# Patient Record
Sex: Male | Born: 1940 | Race: White | Hispanic: No | Smoking: Former smoker
Health system: Southern US, Community
[De-identification: ages and names within clinical notes are randomized; demographics above are authoritative.]

## PROBLEM LIST (undated history)

## (undated) DIAGNOSIS — E785 Hyperlipidemia, unspecified: Secondary | ICD-10-CM

## (undated) DIAGNOSIS — N2889 Other specified disorders of kidney and ureter: Secondary | ICD-10-CM

## (undated) DIAGNOSIS — E049 Nontoxic goiter, unspecified: Secondary | ICD-10-CM

## (undated) DIAGNOSIS — I639 Cerebral infarction, unspecified: Secondary | ICD-10-CM

## (undated) DIAGNOSIS — N200 Calculus of kidney: Secondary | ICD-10-CM

## (undated) DIAGNOSIS — K5792 Diverticulitis of intestine, part unspecified, without perforation or abscess without bleeding: Secondary | ICD-10-CM

## (undated) DIAGNOSIS — I2510 Atherosclerotic heart disease of native coronary artery without angina pectoris: Secondary | ICD-10-CM

## (undated) DIAGNOSIS — I10 Essential (primary) hypertension: Secondary | ICD-10-CM

## (undated) DIAGNOSIS — J449 Chronic obstructive pulmonary disease, unspecified: Secondary | ICD-10-CM

## (undated) DIAGNOSIS — N400 Enlarged prostate without lower urinary tract symptoms: Secondary | ICD-10-CM

## (undated) DIAGNOSIS — M1990 Unspecified osteoarthritis, unspecified site: Secondary | ICD-10-CM

## (undated) HISTORY — PX: TONSILLECTOMY: SUR1361

## (undated) HISTORY — DX: Nontoxic goiter, unspecified: E04.9

## (undated) HISTORY — PX: APPENDECTOMY: SHX54

## (undated) HISTORY — DX: Atherosclerotic heart disease of native coronary artery without angina pectoris: I25.10

## (undated) HISTORY — DX: Chronic obstructive pulmonary disease, unspecified: J44.9

## (undated) HISTORY — DX: Essential (primary) hypertension: I10

## (undated) HISTORY — DX: Cerebral infarction, unspecified: I63.9

## (undated) HISTORY — DX: Unspecified osteoarthritis, unspecified site: M19.90

## (undated) HISTORY — DX: Hyperlipidemia, unspecified: E78.5

## (undated) HISTORY — PX: LITHOTRIPSY: SUR834

---

## 1962-10-07 HISTORY — PX: OTHER SURGICAL HISTORY: SHX169

## 1993-10-07 DIAGNOSIS — I639 Cerebral infarction, unspecified: Secondary | ICD-10-CM

## 1993-10-07 HISTORY — DX: Cerebral infarction, unspecified: I63.9

## 2008-10-11 ENCOUNTER — Ambulatory Visit: Payer: Self-pay | Admitting: Family Medicine

## 2008-10-11 DIAGNOSIS — E785 Hyperlipidemia, unspecified: Secondary | ICD-10-CM | POA: Insufficient documentation

## 2008-10-11 DIAGNOSIS — I10 Essential (primary) hypertension: Secondary | ICD-10-CM | POA: Insufficient documentation

## 2008-10-11 DIAGNOSIS — M129 Arthropathy, unspecified: Secondary | ICD-10-CM | POA: Insufficient documentation

## 2008-10-11 DIAGNOSIS — K219 Gastro-esophageal reflux disease without esophagitis: Secondary | ICD-10-CM | POA: Insufficient documentation

## 2008-10-11 DIAGNOSIS — N318 Other neuromuscular dysfunction of bladder: Secondary | ICD-10-CM | POA: Insufficient documentation

## 2008-10-18 ENCOUNTER — Encounter (INDEPENDENT_AMBULATORY_CARE_PROVIDER_SITE_OTHER): Payer: Self-pay | Admitting: Family Medicine

## 2008-11-03 ENCOUNTER — Encounter (INDEPENDENT_AMBULATORY_CARE_PROVIDER_SITE_OTHER): Payer: Self-pay | Admitting: Family Medicine

## 2008-11-09 ENCOUNTER — Ambulatory Visit: Payer: Self-pay | Admitting: Family Medicine

## 2008-11-09 DIAGNOSIS — E669 Obesity, unspecified: Secondary | ICD-10-CM

## 2008-11-09 LAB — CONVERTED CEMR LAB
Cholesterol, target level: 200 mg/dL
HDL goal, serum: 40 mg/dL
LDL Goal: 100 mg/dL

## 2008-11-10 ENCOUNTER — Encounter (INDEPENDENT_AMBULATORY_CARE_PROVIDER_SITE_OTHER): Payer: Self-pay | Admitting: Family Medicine

## 2008-11-10 LAB — CONVERTED CEMR LAB
Alkaline Phosphatase: 88 units/L (ref 39–117)
CO2: 19 meq/L (ref 19–32)
Calcium: 9.5 mg/dL (ref 8.4–10.5)
Chloride: 103 meq/L (ref 96–112)
Creatinine, Ser: 1.34 mg/dL (ref 0.40–1.50)
Potassium: 4.1 meq/L (ref 3.5–5.3)
Total Protein: 7.9 g/dL (ref 6.0–8.3)

## 2008-12-06 ENCOUNTER — Telehealth (INDEPENDENT_AMBULATORY_CARE_PROVIDER_SITE_OTHER): Payer: Self-pay

## 2008-12-06 ENCOUNTER — Ambulatory Visit: Payer: Self-pay | Admitting: Internal Medicine

## 2008-12-20 ENCOUNTER — Ambulatory Visit (HOSPITAL_COMMUNITY): Admission: RE | Admit: 2008-12-20 | Discharge: 2008-12-20 | Payer: Self-pay | Admitting: Internal Medicine

## 2008-12-20 ENCOUNTER — Ambulatory Visit: Payer: Self-pay | Admitting: Internal Medicine

## 2008-12-20 HISTORY — PX: COLONOSCOPY: SHX5424

## 2008-12-28 ENCOUNTER — Ambulatory Visit: Payer: Self-pay | Admitting: Family Medicine

## 2009-02-09 ENCOUNTER — Ambulatory Visit: Payer: Self-pay | Admitting: Family Medicine

## 2009-02-10 ENCOUNTER — Encounter (INDEPENDENT_AMBULATORY_CARE_PROVIDER_SITE_OTHER): Payer: Self-pay | Admitting: *Deleted

## 2009-02-10 ENCOUNTER — Encounter (INDEPENDENT_AMBULATORY_CARE_PROVIDER_SITE_OTHER): Payer: Self-pay | Admitting: Family Medicine

## 2009-02-10 LAB — CONVERTED CEMR LAB
ALT: 14 units/L (ref 0–53)
AST: 20 units/L (ref 0–37)
Alkaline Phosphatase: 85 units/L (ref 39–117)
BUN: 27 mg/dL — ABNORMAL HIGH (ref 6–23)
CO2: 19 meq/L (ref 19–32)
Calcium: 9.7 mg/dL (ref 8.4–10.5)
Chloride: 104 meq/L (ref 96–112)
Glucose, Bld: 93 mg/dL (ref 70–99)
HCT: 43.9 % (ref 39.0–52.0)
Hemoglobin: 15.2 g/dL (ref 13.0–17.0)
Lymphocytes Relative: 26 % (ref 12–46)
Lymphs Abs: 2.5 10*3/uL (ref 0.7–4.0)
MCHC: 34.6 g/dL (ref 30.0–36.0)
Neutro Abs: 5.8 10*3/uL (ref 1.7–7.7)
Neutrophils Relative %: 59 % (ref 43–77)
Platelets: 239 10*3/uL (ref 150–400)
RBC: 4.69 M/uL (ref 4.22–5.81)
RDW: 12.7 % (ref 11.5–15.5)
TSH: 0.697 microintl units/mL (ref 0.350–4.500)
Total Bilirubin: 0.7 mg/dL (ref 0.3–1.2)
Total CHOL/HDL Ratio: 4.5
Total Protein: 7.8 g/dL (ref 6.0–8.3)

## 2009-02-15 ENCOUNTER — Encounter (INDEPENDENT_AMBULATORY_CARE_PROVIDER_SITE_OTHER): Payer: Self-pay | Admitting: Family Medicine

## 2009-05-08 ENCOUNTER — Ambulatory Visit (HOSPITAL_COMMUNITY): Admission: RE | Admit: 2009-05-08 | Discharge: 2009-05-08 | Payer: Self-pay | Admitting: Family Medicine

## 2009-05-08 ENCOUNTER — Ambulatory Visit: Payer: Self-pay | Admitting: Family Medicine

## 2009-05-08 DIAGNOSIS — R5383 Other fatigue: Secondary | ICD-10-CM

## 2009-05-08 DIAGNOSIS — M25569 Pain in unspecified knee: Secondary | ICD-10-CM

## 2009-05-08 DIAGNOSIS — R5381 Other malaise: Secondary | ICD-10-CM

## 2009-05-09 ENCOUNTER — Encounter (INDEPENDENT_AMBULATORY_CARE_PROVIDER_SITE_OTHER): Payer: Self-pay | Admitting: Family Medicine

## 2009-05-10 LAB — CONVERTED CEMR LAB
ALT: 17 units/L (ref 0–53)
AST: 26 units/L (ref 0–37)
Alkaline Phosphatase: 107 units/L (ref 39–117)
CO2: 18 meq/L — ABNORMAL LOW (ref 19–32)
Calcium: 9.6 mg/dL (ref 8.4–10.5)
Glucose, Bld: 79 mg/dL (ref 70–99)
HCT: 45 % (ref 39.0–52.0)
Lymphocytes Relative: 31 % (ref 12–46)
Lymphs Abs: 2.5 10*3/uL (ref 0.7–4.0)
MCHC: 33.6 g/dL (ref 30.0–36.0)
MCV: 96.8 fL (ref 78.0–100.0)
Neutrophils Relative %: 48 % (ref 43–77)
RBC: 4.65 M/uL (ref 4.22–5.81)
Sodium: 141 meq/L (ref 135–145)
Total Bilirubin: 0.4 mg/dL (ref 0.3–1.2)
WBC: 8.2 10*3/uL (ref 4.0–10.5)

## 2009-05-11 ENCOUNTER — Encounter (INDEPENDENT_AMBULATORY_CARE_PROVIDER_SITE_OTHER): Payer: Self-pay | Admitting: Family Medicine

## 2009-05-16 ENCOUNTER — Encounter (INDEPENDENT_AMBULATORY_CARE_PROVIDER_SITE_OTHER): Payer: Self-pay | Admitting: Family Medicine

## 2009-05-16 ENCOUNTER — Encounter (HOSPITAL_COMMUNITY): Admission: RE | Admit: 2009-05-16 | Discharge: 2009-06-15 | Payer: Self-pay | Admitting: Family Medicine

## 2009-06-01 ENCOUNTER — Ambulatory Visit: Payer: Self-pay | Admitting: Family Medicine

## 2009-11-08 ENCOUNTER — Ambulatory Visit: Payer: Self-pay | Admitting: Family Medicine

## 2009-11-08 DIAGNOSIS — H547 Unspecified visual loss: Secondary | ICD-10-CM | POA: Insufficient documentation

## 2009-11-08 DIAGNOSIS — E049 Nontoxic goiter, unspecified: Secondary | ICD-10-CM | POA: Insufficient documentation

## 2009-11-09 ENCOUNTER — Encounter: Payer: Self-pay | Admitting: Family Medicine

## 2009-11-13 ENCOUNTER — Ambulatory Visit (HOSPITAL_COMMUNITY): Admission: RE | Admit: 2009-11-13 | Discharge: 2009-11-13 | Payer: Self-pay | Admitting: Family Medicine

## 2009-11-18 DIAGNOSIS — H6120 Impacted cerumen, unspecified ear: Secondary | ICD-10-CM

## 2009-11-22 ENCOUNTER — Ambulatory Visit: Payer: Self-pay | Admitting: Family Medicine

## 2009-11-23 LAB — CONVERTED CEMR LAB
AST: 17 units/L (ref 0–37)
Albumin: 4.6 g/dL (ref 3.5–5.2)
BUN: 22 mg/dL (ref 6–23)
Basophils Absolute: 0.1 10*3/uL (ref 0.0–0.1)
Basophils Relative: 1 % (ref 0–1)
CO2: 25 meq/L (ref 19–32)
Calcium: 9.6 mg/dL (ref 8.4–10.5)
Eosinophils Absolute: 1.2 10*3/uL — ABNORMAL HIGH (ref 0.0–0.7)
HDL: 30 mg/dL — ABNORMAL LOW (ref >39)
Hemoglobin: 14.8 g/dL (ref 13.0–17.0)
Indirect Bilirubin: 0.2 mg/dL (ref 0.0–0.9)
LDL Cholesterol: 108 mg/dL — ABNORMAL HIGH (ref 0–99)
Lymphocytes Relative: 27 % (ref 12–46)
Lymphs Abs: 2.5 10*3/uL (ref 0.7–4.0)
MCHC: 33.2 g/dL (ref 30.0–36.0)
Monocytes Absolute: 0.9 10*3/uL (ref 0.1–1.0)
Monocytes Relative: 9 % (ref 3–12)
Platelets: 272 10*3/uL (ref 150–400)
RDW: 13.1 % (ref 11.5–15.5)
Total Bilirubin: 0.3 mg/dL (ref 0.3–1.2)
Total Protein: 7.4 g/dL (ref 6.0–8.3)
VLDL: 10 mg/dL (ref 0–40)
WBC: 9.2 10*3/uL (ref 4.0–10.5)

## 2010-03-13 ENCOUNTER — Ambulatory Visit: Payer: Self-pay | Admitting: Family Medicine

## 2010-03-14 ENCOUNTER — Emergency Department (HOSPITAL_COMMUNITY): Admission: EM | Admit: 2010-03-14 | Discharge: 2010-03-14 | Payer: Self-pay | Admitting: Emergency Medicine

## 2010-03-18 LAB — CONVERTED CEMR LAB
AST: 21 units/L (ref 0–37)
Bilirubin, Direct: 0.2 mg/dL (ref 0.0–0.3)
CO2: 20 meq/L (ref 19–32)
Calcium: 9.2 mg/dL (ref 8.4–10.5)
HDL: 26 mg/dL — ABNORMAL LOW (ref >39)
Indirect Bilirubin: 0.2 mg/dL (ref 0.0–0.9)
Total CHOL/HDL Ratio: 3.7
VLDL: 14 mg/dL (ref 0–40)
Vit D, 25-Hydroxy: 51 ng/mL (ref 30–89)

## 2010-04-05 ENCOUNTER — Ambulatory Visit: Payer: Self-pay | Admitting: Family Medicine

## 2010-04-05 DIAGNOSIS — E860 Dehydration: Secondary | ICD-10-CM

## 2010-04-05 DIAGNOSIS — R9431 Abnormal electrocardiogram [ECG] [EKG]: Secondary | ICD-10-CM | POA: Insufficient documentation

## 2010-04-05 DIAGNOSIS — I498 Other specified cardiac arrhythmias: Secondary | ICD-10-CM

## 2010-04-06 LAB — CONVERTED CEMR LAB
BUN: 22 mg/dL (ref 6–23)
Basophils Absolute: 0.1 10*3/uL (ref 0.0–0.1)
Basophils Relative: 1 % (ref 0–1)
Creatinine, Ser: 1.59 mg/dL — ABNORMAL HIGH (ref 0.40–1.50)
Glucose, Bld: 88 mg/dL (ref 70–99)
HCT: 45.1 % (ref 39.0–52.0)
Lymphocytes Relative: 29 % (ref 12–46)
Lymphs Abs: 2.4 10*3/uL (ref 0.7–4.0)
MCHC: 32.8 g/dL (ref 30.0–36.0)
Monocytes Absolute: 0.9 10*3/uL (ref 0.1–1.0)
Neutro Abs: 4.3 10*3/uL (ref 1.7–7.7)
Neutrophils Relative %: 53 % (ref 43–77)
Platelets: 227 10*3/uL (ref 150–400)
Potassium: 3.7 meq/L (ref 3.5–5.3)
RDW: 13.7 % (ref 11.5–15.5)
Sodium: 142 meq/L (ref 135–145)

## 2010-04-11 ENCOUNTER — Ambulatory Visit: Payer: Self-pay | Admitting: Cardiovascular Disease

## 2010-04-11 DIAGNOSIS — I2510 Atherosclerotic heart disease of native coronary artery without angina pectoris: Secondary | ICD-10-CM | POA: Insufficient documentation

## 2010-04-18 ENCOUNTER — Ambulatory Visit: Payer: Self-pay | Admitting: Cardiology

## 2010-04-18 ENCOUNTER — Encounter: Payer: Self-pay | Admitting: Cardiovascular Disease

## 2010-04-18 ENCOUNTER — Encounter (HOSPITAL_COMMUNITY): Admission: RE | Admit: 2010-04-18 | Discharge: 2010-04-18 | Payer: Self-pay | Admitting: Cardiovascular Disease

## 2010-06-05 ENCOUNTER — Ambulatory Visit: Payer: Self-pay | Admitting: Family Medicine

## 2010-06-26 ENCOUNTER — Ambulatory Visit: Payer: Self-pay | Admitting: Family Medicine

## 2010-06-28 ENCOUNTER — Encounter: Payer: Self-pay | Admitting: Cardiovascular Disease

## 2010-07-25 ENCOUNTER — Telehealth: Payer: Self-pay | Admitting: Family Medicine

## 2010-10-16 ENCOUNTER — Encounter: Payer: Self-pay | Admitting: Family Medicine

## 2010-10-16 LAB — CONVERTED CEMR LAB
AST: 16 units/L (ref 0–37)
Alkaline Phosphatase: 104 units/L (ref 39–117)
Calcium: 9.8 mg/dL (ref 8.4–10.5)
Creatinine, Ser: 1.19 mg/dL (ref 0.40–1.50)
HDL: 29 mg/dL — ABNORMAL LOW (ref >39)
Indirect Bilirubin: 0.4 mg/dL (ref 0.0–0.9)
Total Bilirubin: 0.6 mg/dL (ref 0.3–1.2)
Total CHOL/HDL Ratio: 4.6
VLDL: 12 mg/dL (ref 0–40)

## 2010-10-24 ENCOUNTER — Ambulatory Visit
Admission: RE | Admit: 2010-10-24 | Discharge: 2010-10-24 | Payer: Self-pay | Source: Home / Self Care | Attending: Family Medicine | Admitting: Family Medicine
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
## 2010-11-06 NOTE — Progress Notes (Signed)
Summary: refill  Phone Note Call from Patient   Summary of Call: please call pt he needs refills on meds. [phone] Initial call taken by: Rudene Anda,  July 25, 2010 10:57 AM    Prescriptions: LISINOPRIL 20 MG TABS (LISINOPRIL) Take 1 tablet by mouth once a day  #90 x 0   Entered by:   Adella Hare LPN   Authorized by:   Syliva Overman MD   Signed by:   Adella Hare LPN on 29/56/2130   Method used:   Faxed to ...       [facility] (mail-order)             , [place]         Ph: [phone]       Fax: [phone]   RxID:   [id number]
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
## 2010-11-06 NOTE — Letter (Signed)
Summary: HANDICAPP CARD  HANDICAPP CARD   Imported By: Lind Guest 11/08/2009 14:22:07  _____________________________________________________________________  External Attachment:    Type:   Image     Comment:   External Document

## 2010-11-06 NOTE — Letter (Signed)
Summary: Sheldon Treadmill (Nuc Med Stress)  [facility]  [address] [place] [address]   Phone: [phone]  Fax: [phone]    Nuclear Medicine 1-Day Stress Test Information Sheet  Re:     Randy Garrett   DOB:     08-06-1941 MRN:     [id number] Weight:  Appointment Date: Register at: Appointment Time: Referring MD:  ___Exercise Stress  __Adenosine   __Dobutamine  _X_Lexiscan  __Persantine   __Thallium  Urgency: ____1 (next day)   ____2 (one week)    ____3 (PRN)  Patient will receive Follow Up call with results: Patient needs follow-up appointment:  Instructions regarding medication:  How to prepare for your stress test: 1. DO NOT eat or dring 6 hours prior to your arrival time. This includes no caffeine (coffee, tea, sodas, chocolate) if you were instructed to take your medications, drink water with it. 2. DO NOT use any tobacco products for at leaset 8 hours prior to arrival. 3. DO NOT wear dresses or any clothing that may have metal clasps or buttons. 4. Wear short sleeve shirts, loose clothing, and comfortalbe walking shoes. 5. DO NOT use lotions, oils or powder on your chest before the test. 6. The test will take approximately 3-4 hours from the time you arrive until completion. 7. To register the day of the test, go to the Short Stay entrance at [facility]. 8. If you must cancel your test, call [phone] as soon as you are aware.  After you arrive for test:   When you arrive at [facility], you will go to Short Stay to be registered. They will then send you to Radiology to check in. The Nuclear Medicine Tech will get you and start an IV in your arm or hand. A small amount of a radioactive tracer will then be injected into your IV. This tracer will then have to circulate for 30-45 minutes. During this time you will wait in the waiting room and you will be able to drink something without caffeine. A series of pictures will be  taken of your heart follwoing this waiting period. After the 1st set of pictures you will go to the stress lab to get ready for your stress test. During the stress test, another small amount of a radioactive tracer will be injected through your IV. When the stress test is complete, there is a short rest period while your heart rate and blood pressure will be monitored. When this monitoring period is complete you will have another set of pictrues taken. (The same as the 1st set of pictures). These pictures are taken between 15 minutes and 1 hour after the stress test. The time depends on the type of stress test you had. Your doctor will inform you of your test results within 7 days after test.    The possibilities of certain changes are possible during the test. They include abnormal blood pressure and disorders of the heart. Side effects of persantine or adenosine can include flushing, chest pain, shortness of breath, stomach tightness, headache and light-headedness. These side effects usually do not last long and are self-resolving. Every effort will be made to keep you comfortable and to minimize complications by obtaining a medical history and by close observation during the test. Emergency equipment, medications, and trained personnel are available to deal with any unusual situation which may arise.  Please notify office at least 48 hours in advance if you are unable to keep  this appt.

## 2010-11-06 NOTE — Assessment & Plan Note (Signed)
Summary: ear  cleaning  Nurse Visit   Vital Signs:  Patient profile:   70 year old male Weight:      208.25 pounds BP sitting:   109 / 64  Vitals Entered By: Adella Hare LPN (November 22, 2009 11:28 AM) Comments patient in today for bilateral ear irrigation. both ears cleared. pt tolerated procedure well. Adella Hare LPN  November 22, 2009 11:29 AM     Allergies: No Known Drug Allergies  Orders Added: 1)  Cerumen Impaction Removal [[id number]]

## 2010-11-06 NOTE — Assessment & Plan Note (Signed)
Summary: PER DR.SIMPSON FOR EVALUATION OF ABNORMAL EKG/TG   Visit Type:  Initial Consult Primary Provider:  Syliva Overman MD  CC:  EVAUATION OF ABNORMAL EKG.  History of Present Illness: Randy Garrett is seen today at the request of Dr Lodema Hong for abnromal ECG, bradycardia.  He has had a previous CVA.  Denies SSCP or history of CAD.  Recently seen in ER for dehydration with BUN 72 and bradycardia HR 44 but on both Atenolol and HCTZ.  His CVA was in 95 with right sided weakness.  His activity is limited by RLE weakness and knee pain.  He has not had a recent echo or stress test.  I reviewed his ECG from Dr Luther Parody office and it showed SB 75 with ? old IMI Q's in 2,3,F.  He quit smoking many years ago, is Rx for elevated lipids and HTN>  Despite bradycardia and prerenal azotemia he has continued taking HCTZ and Atenolol.  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 One Two Times A Day 2)  Atenolol 25 Mg Tabs (Atenolol) .... Take 1 Tablet By Mouth Once Daily 3)  Simvastatin 20 Mg Tabs (Simvastatin) .... One Daily 4)  Garlic 1000 Mg Caps  (Garlic) .... One Daily 5)  Fish Oil 1200 Mg Caps (Omega-3 Fatty Acids) .... One Three Times A Day 6)  Multivitamins  Caps (Multiple Vitamin) .... One Daily 7)  Bayer Aspirin 325 Mg Tabs (Aspirin) .... One Daily 8)  Vitamin D3 400 Unit Tabs (Cholecalciferol) .... 5 Pills Daily 9)  Adalat Cc 60 Mg Xr24h-Tab (Nifedipine) .... One Daily 10)  Power Wheelchair .... Use As Directed 11)  Tylenol Extra Strength 500 Mg Tabs (Acetaminophen) .... 2 Tabs By Mouth Once Daily 12)  Antacid Double Strength 700-300 Mg Chew (Ca Carbonate-Mag Hydroxide) .... As Needed 13)  Lisinopril 20 Mg Tabs (Lisinopril) .... 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  Family History: Last updated: 11/26/09 Mom - 75 dead - breast cancer, died in 55 Dad - Did not know as parents were seperated - had MI at 32 and died Brothers - none Sisters - none KIds -  Boys - 2 age 25 and 90 -  healthy Girls - 2 - age 94, 68 - healthy  Social History: Last updated: 11/08/2009 Divorced, relocated from [place]  since 2009 Former Smoker  quit in 1995 Alcohol use-no Drug use-no Occupation - Retired, Tour manager, disabled as a result of cVA x 2  Review of Systems       Denies fever, malais, weight loss, blurry vision, decreased visual acuity, cough, sputum, SOB, hemoptysis, pleuritic pain, palpitaitons, heartburn, abdominal pain,  melena, lower extremity edema, claudication, or rash.   Vital Signs:  Patient profile:   70 year old male Height:      69 inches Weight:      202 pounds Pulse rate:   51 / minute BP sitting:   133 / 59  (right arm)  Vitals Entered By: [name], CNA (April 11, 2010 10:08 AM)  Physical Exam  General:  Affect appropriate Healthy:  appears stated age HEENT: normal Neck supple with no adenopathy JVP normal no bruits no thyromegaly Lungs clear with no wheezing and good diaphragmatic motion Heart:  S1/S2 no murmur,rub, gallop or click PMI normal Abdomen: benighn, BS positve, no tenderness, no AAA no bruit.  No HSM or HJR Distal pulses intact with no bruits No edema Neuro RLE weakness.  Poor balance Skin warm and dry    Impression & Recommendations:  Problem # 1:  ABNORMAL ELECTROCARDIOGRAM (ICD-794.31) Echo to R/O RWMA and given previous CVA myovue to R/O CAD The following medications were removed from the medication list:    Lisinopril-hydrochlorothiazide 20-12.5 Mg Tabs (Lisinopril-hydrochlorothiazide) .Marland Kitchen... Take 1 tablet by mouth once a day His updated medication list for this problem includes:    Atenolol 25 Mg Tabs (Atenolol) .Marland Kitchen... Take 1 tablet by mouth once daily    Bayer Aspirin 325 Mg Tabs (Aspirin) ..... One daily    Adalat Cc 60 Mg Xr24h-tab (Nifedipine) ..... One daily    Lisinopril 20 Mg Tabs (Lisinopril) .Marland Kitchen... Take 1 tablet by mouth once daily  His updated medication list for this problem includes:    Atenolol 25 Mg Tabs (Atenolol) .Marland Kitchen... Take 1 tablet by mouth once daily    Lisinopril-hydrochlorothiazide 20-12.5 Mg Tabs (Lisinopril-hydrochlorothiazide) .Marland Kitchen... Take 1 tablet by mouth once a day    Bayer Aspirin 325 Mg Tabs (Aspirin) ..... One daily    Adalat Cc 60 Mg Xr24h-tab (Nifedipine) ..... One daily  Problem # 2:  BRADYCARDIA (ICD-427.89)  Decrease atenolol to 25 mg and re-evaluate His updated medication list for this problem includes:    Atenolol  50 Mg Tabs (Atenolol) ..... One daily    Lisinopril-hydrochlorothiazide 20-12.5 Mg Tabs (Lisinopril-hydrochlorothiazide) .Marland Kitchen... Take 1 tablet by mouth once a day    Bayer Aspirin 325 Mg Tabs (Aspirin) ..... One daily    Adalat Cc 60 Mg Xr24h-tab (Nifedipine) ..... One daily  Orders: 2-D Echocardiogram (2D Echo) Nuclear Stress Test (Nuc Stress Test)  The following medications were removed from the medication list:    Lisinopril-hydrochlorothiazide 20-12.5 Mg Tabs (Lisinopril-hydrochlorothiazide) .Marland Kitchen... Take 1 tablet by mouth once a day His updated medication list for this problem includes:    Atenolol 25 Mg Tabs (Atenolol) .Marland Kitchen... Take 1 tablet by mouth once daily    Bayer Aspirin 325 Mg Tabs (Aspirin) ..... One daily    Adalat Cc 60 Mg Xr24h-tab (Nifedipine) ..... One daily    Lisinopril 20 Mg Tabs (Lisinopril) .Marland Kitchen... Take 1 tablet by mouth once daily  Problem # 3:  DEHYDRATION (ICD-276.51) Stop HCTZ and continue ACE.  F/U labs  with Dr Lodema Hong  Problem # 4:  HYPERLIPIDEMIA (ICD-272.4)  Continue statin.  LFTS normal and recent labs LDL less than 100 His updated medication list for this problem includes:    Gemfibrozil 600 Mg Tabs (Gemfibrozil) ..... One two times a day    Simvastatin 20 Mg Tabs (Simvastatin) ..... One daily  His updated medication list for this problem includes:    Gemfibrozil 600 Mg Tabs (Gemfibrozil) ..... One two times a day    Simvastatin 20 Mg Tabs (Simvastatin) ..... One daily  Patient Instructions: 1)  Your physician recommends that you schedule a follow-up appointment in: as needed  2)  Your physician has recommended you make the following change in your medication: Decrease Atenolol 25mg  by mouth once daily and Start taking Lisinopril 20mg  by mouth once daily 3)    4)  Your physician has requested that you have an echocardiogram.  Echocardiography is a painless test that uses sound waves to create images of your heart. It provides your doctor with information  about the size and shape of your heart and how well your heart's chambers and valves are working.  This procedure takes approximately one hour. There are no restrictions for this procedure. 5)  Your physician has requested that you have an [employer]  For further information please visit [email or web address]  Please follow instruction sheet, as given. Prescriptions: LISINOPRIL 20 MG TABS (LISINOPRIL) take 1 tablet by mouth once daily  #15 x 0   Entered by:   Larita Fife Via LPN   Authorized by:   Colon Branch, MD, [facility]   Signed by:   Larita Fife Via LPN on 08/03/2535   Method used:   Electronically to        [facility]  [address] (retail)       [address]       [place], [place]  [address]       Ph: [phone]       Fax: [phone]   RxID:   [id number] LISINOPRIL 20 MG TABS (LISINOPRIL) take 1 tablet by mouth once daily  #90 x 3   Entered by:   Larita Fife Via LPN   Authorized by:   Colon Branch, MD, [facility]   Signed by:   Larita Fife Via LPN on 32/35/5732   Method used:   Faxed to ...       [facility] (mail-order)             , [place]         Ph: [phone]       Fax: [phone]   RxID:   [id number]   Prevention & Chronic Care Immunizations   Influenza vaccine: Hx of  (08/04/2008)   Influenza vaccine due: 08/2009    Tetanus booster: Not documented    Pneumococcal vaccine: Hx of  (10/11/2008)    H. zoster vaccine: Not documented  Colorectal Screening   Hemoccult: Not documented    Colonoscopy: Normal per Dr. Jena Gauss  (12/20/2008)   Colonoscopy due: 01/2014  Other Screening   PSA: 1.06  (11/22/2009)   Smoking status: quit  (10/11/2008)  Lipids   Total Cholesterol: 97  (03/13/2010)   LDL: 57  (03/13/2010)   LDL Direct: Not documented   HDL: 26  (03/13/2010)   Triglycerides: 72  (03/13/2010)    SGOT (AST): 21  (03/13/2010)   SGPT (ALT): 20  (03/13/2010)   Alkaline phosphatase: 106  (03/13/2010)   Total bilirubin:  0.4   (03/13/2010)  Hypertension   Last Blood Pressure: 133 / 59  (04/11/2010)   Serum creatinine: 1.59  (04/05/2010)   Serum potassium 3.7  (04/05/2010)  Self-Management Support :    Hypertension self-management support: Not documented    Lipid self-management support: Not documented

## 2010-11-06 NOTE — Assessment & Plan Note (Signed)
Summary: NEW PATIENT / HUMAMNA   Vital Signs:  Patient profile:   70 year old male Height:      69 inches Weight:      208 pounds BMI:     30.83 O2 Sat:      95 % Pulse rate:   66 / minute Pulse rhythm:   regular Resp:     16 per minute BP sitting:   100 / 62 Cuff size:   regular  Vitals Entered By: Everitt Amber 11-18-2009 8:04 AM)  Nutrition Counseling: Patient's BMI is greater than 25 and therefore counseled on weight management options. CC: New Patient   Primary Care Provider:  Syliva Overman MD  CC:  New Patient.  History of Present Illness: New pt eval for this very pleasant elderly gentleman, who has had a stroke with residual hemiparesis and reduced mobility.He ambulates with assistance, and denies any recent falls. He has no new or troubling concerns. he is also aware of failing vision and has not had an eye exam for some time. Reports  that he has otherise been  doing well. Denies recent fever or chills. Denies sinus pressure, nasal congestion , ear pain or sore throat. Denies chest congestion, or cough productive of sputum. Denies chest pain, palpitations, PND, orthopnea or leg swelling. Denies abdominal pain, nausea, vomitting, diarrhea or constipation. Denies change in bowel movements or bloody stool. Denies dysuria , frequency, incontinence or hesitancy.  Denies headaches, vertigo, seizures. Denies depression, anxiety or insomnia. Denies  rash, lesions, or itch.      Current Medications (verified): 1)  Gemfibrozil 600 Mg Tabs (Gemfibrozil) .... One Two Times A Day 2)  Atenolol 50 Mg Tabs (Atenolol) .... One Daily 3)  Lisinopril-Hydrochlorothiazide 20-12.5 Mg Tabs (Lisinopril-Hydrochlorothiazide) 4)  Simvastatin 20 Mg Tabs (Simvastatin) .... One Daily 5)  Garlic 1000 Mg Caps (Garlic) .... One Daily 6)  Fish Oil 1200 Mg Caps (Omega-3 Fatty Acids) .... One Three Times A Day 7)  Multivitamins  Caps (Multiple Vitamin) .... One Daily 8)  Bayer  Aspirin 325 Mg Tabs (Aspirin) .... One Daily 9)  Vitamin D3 400 Unit Tabs (Cholecalciferol) .... 5 Pills Daily 10)  Adalat Cc 60 Mg Xr24h-Tab (Nifedipine) .... One Daily 11)  Kapidex 60 Mg Cpdr (Dexlansoprazole) .... One Daily 12)  Power Wheelchair .... Use As Directed 13)  Tylenol Extra Strength 500 Mg Tabs (Acetaminophen) .... 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 (ICD-342.90) ARTHRITIS (ICD-716.90) HYPERTENSION (ICD-401.9) OVERACTIVE BLADDER (ICD-596.51) GERD (ICD-530.81)  Family History: Last updated: 11/18/09 Mom - 75 dead - breast cancer, died in 43 Dad - Did not know as parents were seperated - had MI at 74 and died Brothers - none Sisters - none KIds -  Boys - 2 age 57 and 55 - healthy Girls - 2 - age 71, 35 - healthy  Social History: Last updated: 11-18-2009 Divorced, relocated from [place]  since 2009 Former Smoker  quit in 1995 Alcohol use-no Drug use-no Occupation - Retired, Tour manager, disabled as a result of cVA x 2  Risk Factors: Smoking Status: quit (10/11/2008)  Past Surgical History: Diverticulitis - 1964 - removed part of colon but unsure as to which part Tonsillectomy - age 89 Appendectomy - 1964 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 ebndartectomy  Family History: Mom - 13 dead - breast cancer, died in 25 Dad - Did not know as parents were seperated - had MI at 14 and died Brothers - none Sisters - none KIds -  Boys - 2 age 21 and 83 - healthy Girls - 2 - age 28, 25 - healthy  Social History: Divorced, relocated from [place]  since 2009 Former Smoker  quit in 1995 Alcohol use-no Drug use-no Occupation - Retired, Tour manager, disabled as a  result of cVA x 2  Review of Systems      See HPI Eyes:  Complains of vision loss-both eyes; eye exam past due , pt interestd in eval. GU:  Complains of nocturia and urinary hesitancy; poor urinary stream gets up on avg 3 times per night. MS:  Complains of low back pain, mid back pain, and muscle weakness. Neuro:  Complains of visual disturbances and weakness; chronic right lower ext weakness s/p cVA, pt ambulates with a cane. Endo:  Denies cold intolerance, excessive hunger, excessive thirst, excessive urination, heat intolerance, polyuria, and weight change. Heme:  Denies abnormal bruising and bleeding. Allergy:  Complains of seasonal allergies; denies hives or rash and sneezing; mild.  Physical Exam  General:  Well-developed,well-nourished,in no acute distress; alert,appropriate and cooperative throughout examination HEENT: No facial asymmetry,  EOMI, No sinus tenderness, bilateral cerumen impaction, oropharynx  pink and moist. Goiter.  Chest: Clear to auscultation bilaterally.  CVS: S1, S2, No murmurs, No S3.   Abd: Soft, Nontender.  ZO:XWRUEAVWU ROM spine, hips, shoulders and knees.  Ext: No edema.   CNS: CN 2-12 intact,reduced power and sensation in right lower extremity which is not new following a cVA  Skin: Intact, no visible lesions or rashes.  Psych: Good eye contact, normal affect.  Memory intact, not anxious or depressed appearing.    Impression & Recommendations:  Problem # 1:  UNSPECIFIED VISUAL LOSS (ICD-369.9) Assessment Comment Only  Orders: Ophthalmology Referral (Ophthalmology)  Problem # 2:  OBESITY (ICD-278.00) Assessment: Unchanged  Ht: 69 (11/08/2009)   Wt: 208 (11/08/2009)   BMI: 30.83 (11/08/2009)  Problem # 3:  HYPERTENSION (ICD-401.9) Assessment: Unchanged  His updated medication list for this problem includes:    Atenolol 50 Mg Tabs (Atenolol) ..... One daily    Lisinopril-hydrochlorothiazide 20-12.5 Mg Tabs (Lisinopril-hydrochlorothiazide)  .Marland Kitchen... Take 1 tablet by mouth once a day    Adalat Cc 60 Mg Xr24h-tab (Nifedipine) ..... One daily  BP today: 100/62 Prior BP: 138/79 (06/01/2009)  Prior 10 Yr Risk Heart Disease: 14 % (06/01/2009)  Labs Reviewed: K+: 3.7 (05/09/2009) Creat: : 1.37 (05/09/2009)   Chol: 136 (02/10/2009)   HDL: 30 (02/10/2009)   LDL: 94 (02/10/2009)   TG: 61 (02/10/2009)  Problem # 4:  HEMIPARESIS, RIGHT  - LEG DUE TO CVA (ICD-342.90) Assessment: Unchanged  Problem # 5:  CERUMEN IMPACTION (ICD-380.4) Assessment: Comment Only return for ear irrigation by nursing staff  Problem # 6:  GERD (ICD-530.81) Assessment: Improved  His updated medication list for this problem includes:    Kapidex 60 Mg Cpdr (Dexlansoprazole) ..... One daily  Problem # 7:  HYPERLIPIDEMIA (ICD-272.4) Assessment: Comment Only  His updated medication list for this problem includes:    Gemfibrozil 600 Mg Tabs (Gemfibrozil) ..... One two times a day    Simvastatin 20 Mg Tabs (Simvastatin) ..... One daily  Orders: T-Hepatic Function [id number]) T-Lipid Profile [phone])  Labs Reviewed: SGOT: 26 (05/09/2009)   SGPT: 17 (05/09/2009)  Lipid  Goals: Chol Goal: 200 (11/09/2008)   HDL Goal: 40 (11/09/2008)   LDL Goal: 100 (11/09/2008)   TG Goal: 150 (11/09/2008)  Prior 10 Yr Risk Heart Disease: 14 % (06/01/2009)   HDL:30 (02/10/2009)  LDL:94 (02/10/2009)  Chol:136 (02/10/2009)  Trig:61 (02/10/2009)  Complete Medication List: 1)  Gemfibrozil 600 Mg Tabs (Gemfibrozil) .... One two times a day 2)  Atenolol 50 Mg Tabs (Atenolol) .... One daily 3)  Lisinopril-hydrochlorothiazide 20-12.5 Mg Tabs (Lisinopril-hydrochlorothiazide) .... Take 1 tablet by mouth once a day 4)  Simvastatin 20 Mg Tabs (Simvastatin) .... One daily 5)  Garlic 1000 Mg Caps (Garlic) .... One daily 6)  Fish Oil 1200 Mg Caps (Omega-3 fatty acids) .... One three times a day 7)  Multivitamins Caps (Multiple vitamin) .... One daily 8)  Bayer Aspirin 325 Mg  Tabs (Aspirin) .... One daily 9)  Vitamin D3 400 Unit Tabs (Cholecalciferol) .... 5 pills daily 10)  Adalat Cc 60 Mg Xr24h-tab (Nifedipine) .... One daily 11)  Kapidex 60 Mg Cpdr (Dexlansoprazole) .... One daily 12)  Power Wheelchair  .... Use as directed 13)  Tylenol Extra Strength 500 Mg Tabs (Acetaminophen) .... 2 three times a day  Other Orders: T-Basic Metabolic Panel [phone]) T-CBC w/Diff [id number]) T-TSH [phone]) T-PSA [phone]) Radiology Referral (Radiology)  Patient Instructions: 1)  nurse visit in 2 weeks bilateral ear  irrigation. 2)  MD f/u in 3.5 months. 3)  BMP prior to visit, ICD-9: 4)  Hepatic Panel prior to visit, ICD-9:  fasting asap 5)  Lipid Panel prior to visit, ICD-9: 6)  TSH prior to visit, ICD-9: 7)  CBC w/ Diff prior to visit, ICD-9: 8)  PSA prior to visit, ICD-9: 9)  you will be referred for a thyroid ultrasound 10)  and for an eye exam  Prescriptions: ADALAT CC 60 MG XR24H-TAB (NIFEDIPINE) One daily  #90 x 3   Entered by:   Everitt Amber   Authorized by:   Syliva Overman MD   Signed by:   Everitt Amber on 11/08/2009   Method used:   Printed then faxed to ...       [facility] (mail-order)       [address]       [place], [place]  [id number]       Ph: [phone]       Fax: [phone]   RxID:   [id number] SIMVASTATIN 20 MG TABS (SIMVASTATIN) One daily  #90 x 3   Entered by:   Everitt Amber   Authorized by:   Syliva Overman MD   Signed by:   Everitt Amber on 11/08/2009   Method used:   Printed then faxed to ...       [facility] (mail-order)       [address]       [place], [place]  [id number]       Ph: [phone]       Fax: [phone]   RxID:   [id number] LISINOPRIL-HYDROCHLOROTHIAZIDE 20-12.5 MG TABS (LISINOPRIL-HYDROCHLOROTHIAZIDE) Take 1 tablet by mouth once a day  #90 x 3   Entered by:   Everitt Amber   Authorized by:   Syliva Overman MD   Signed by:   Everitt Amber on 11/08/2009    Method used:   Printed then faxed to ...       [facility] (mail-order)       [address]       [place], [place]  [id number]  Ph: [phone]       Fax: [phone]   RxID:   [id number] GEMFIBROZIL 600 MG TABS (GEMFIBROZIL) One two times a day  #180 x 3   Entered by:   Everitt Amber   Authorized by:   Syliva Overman MD   Signed by:   Everitt Amber on 11/08/2009   Method used:   Printed then faxed to ...       [facility] (mail-order)       [address]       [place], [place]  [id number]       Ph: [phone]       Fax: [phone]   RxID:   [id number]

## 2010-11-06 NOTE — Miscellaneous (Signed)
Summary: Echo  Clinical Lists Changes  Observations: Added new observation of ECHOINTERP:  Study Conclusions    - Left ventricle: The cavity size was normal. There was mild     concentric hypertrophy. Systolic function was normal. The     estimated ejection fraction was in the range of 60% to 65%. Wall     motion was normal; there were no regional wall motion     abnormalities.   - Aortic valve: Mildly calcified annulus. Trileaflet; mildly     calcified leaflets. Cusp separation was mildly reduced. Valve     area: 2.45cm^2(VTI). Valve area: 2.62cm^2 (Vmax).   - Mitral valve: Calcified annulus.   - Left atrium: The atrium was mildly to moderately dilated.   Transthoracic echocardiography. M-mode, complete 2D, spectral   Doppler, and color Doppler. Height: Height: 175.3cm. Height: 69in.   Weight: Weight: 89.8kg. Weight: 197.6lb. Body mass index: BMI:   29.2kg/m^2. Body surface area: BSA: 2.75m^2. Patient status:   Outpatient. Location: Echo laboratory. (04/18/2010 10:50)      Echocardiogram  Procedure date:  04/18/2010  Findings:       Study Conclusions    - Left ventricle: The cavity size was normal. There was mild     concentric hypertrophy. Systolic function was normal. The     estimated ejection fraction was in the range of 60% to 65%. Wall     motion was normal; there were no regional wall motion     abnormalities.   - Aortic valve: Mildly calcified annulus. Trileaflet; mildly     calcified leaflets. Cusp separation was mildly reduced. Valve     area: 2.45cm^2(VTI). Valve area: 2.62cm^2 (Vmax).   - Mitral valve: Calcified annulus.   - Left atrium: The atrium was mildly to moderately dilated.   Transthoracic echocardiography. M-mode, complete 2D, spectral   Doppler, and color Doppler. Height: Height: 175.3cm. Height: 69in.   Weight: Weight: 89.8kg. Weight: 197.6lb. Body mass index: BMI:   29.2kg/m^2. Body surface area: BSA: 2.21m^2. Patient status:   Outpatient.  Location: Echo laboratory.

## 2010-11-06 NOTE — Assessment & Plan Note (Signed)
Summary: flu shot  Nurse Visit  Comments Patient in to recieve flu vaccine     Allergies: No Known Drug Allergies  Orders Added: 1)  Influenza Vaccine MCR [00025]   Influenza Vaccine    Vaccine Type: Fluvax MCR    Site: left deltoid    Mfr: novartis     Dose: 0.5 ml    Route: IM    Given by: Everitt Amber LPN    Exp. Date: 02/2011    Lot #: [id number]

## 2010-11-06 NOTE — Letter (Signed)
Summary: handicapp card  handicapp card   Imported By: Lind Guest 11/09/2009 08:57:52  _____________________________________________________________________  External Attachment:    Type:   Image     Comment:   External Document

## 2010-11-14 NOTE — Assessment & Plan Note (Signed)
Summary: office visit   Vital Signs:  Patient profile:   69 year old male Height:      69 inches Weight:      209 pounds BMI:     30.98 O2 Sat:      96 % Pulse rate:   50 / minute Pulse rhythm:   regular Resp:     16 per minute BP sitting:   120 / 60  (left arm) Cuff size:   regular  Vitals Entered By: Everitt Amber LPN (October 24, 2010 11:17 AM)  Nutrition Counseling: Patient's BMI is greater than 25 and therefore counseled on weight management options. CC: Follow up chronic problems   Primary Care Provider:  Syliva Overman MD  CC:  Follow up chronic problems.  History of Present Illness: Reports  that he is doing well. Denies recent fever or chills. Denies sinus pressure, nasal congestion , ear pain or sore throat. Denies chest congestion, or cough productive of sputum. Denies chest pain, palpitations, PND, orthopnea or leg swelling. Denies abdominal pain, nausea, vomitting, diarrhea or constipation. Denies change in bowel movements or bloody stool. Denies dysuria , frequency, incontinence or hesitancy. Denies  joint pain, swelling, or reduced mobility. Denies headaches, vertigo, seizures. Denies depression, anxiety or insomnia. Denies  rash, lesions, or itch.     Current Medications (verified): 1)  Gemfibrozil 600 Mg Tabs (Gemfibrozil) .... One Two Times A Day 2)  Atenolol 25 Mg Tabs (Atenolol) .... Take 1 Tablet By Mouth Once Daily 3)  Simvastatin 20 Mg Tabs (Simvastatin) .... One Daily 4)  Garlic 1000 Mg Caps (Garlic) .... One Daily 5)  Fish Oil 1200 Mg Caps (Omega-3 Fatty Acids) .... One Three Times A Day 6)  Multivitamins  Caps (Multiple Vitamin) .... One Daily 7)  Bayer Aspirin 325 Mg Tabs (Aspirin) .... One Daily 8)  Vitamin D3 400 Unit Tabs (Cholecalciferol) .... 5 Pills Daily 9)  Adalat Cc 60 Mg Xr24h-Tab (Nifedipine) .... One Daily 10)  Power Wheelchair .... Use As Directed 11)  Tylenol Extra Strength 500 Mg Tabs (Acetaminophen) .... 2 Tabs By Mouth  Once Daily 12)  Antacid Double Strength 700-300 Mg Chew (Ca Carbonate-Mag Hydroxide) .... As Needed 13)  Lisinopril 20 Mg Tabs (Lisinopril) .... Take 1 Tablet By Mouth Once Daily  Allergies (verified): No Known Drug Allergies  Review of Systems      See HPI Eyes:  Denies discharge, eye pain, and red eye. ENT:  Complains of decreased hearing. Endo:  Denies cold intolerance, excessive hunger, excessive thirst, and excessive urination. Heme:  Denies abnormal bruising and bleeding. Allergy:  Denies hives or rash and itching eyes.  Physical Exam  General:  Well-developed,well-nourished,in no acute distress; alert,appropriate and cooperative throughout examination HEENT: No facial asymmetry,  EOMI, No sinus tenderness, TM's Clear, oropharynx  pink and moist.   Chest: Clear to auscultation bilaterally.  CVS: S1, S2, No murmurs, No S3.   Abd: Soft, Nontender.  MS: Adequate ROM spine, hips, shoulders and knees.  Ext: No edema.   CNS: CN 2-12 intact, power tone and sensation normal throughout, except for right lower ext weakness   Skin: Intact, no visible lesions or rashes.  Psych: Good eye contact, normal affect.  Memory intact, not anxious or depressed appearing.    Impression & Recommendations:  Problem # 1:  HYPERLIPIDEMIA (ICD-272.4) Assessment Comment Only  His updated medication list for this problem includes:    Gemfibrozil 600 Mg Tabs (Gemfibrozil) ..... One two times a day  Simvastatin 20 Mg Tabs (Simvastatin) ..... One daily  Orders: T-Hepatic Function [id number]) T-Lipid Profile [phone]) Low fat dietdiscussed and encouraged  Labs Reviewed: SGOT: 16 (10/16/2010)   SGPT: 18 (10/16/2010)  Lipid Goals: Chol Goal: 200 (11/09/2008)   HDL Goal: 40 (11/09/2008)   LDL Goal: 100 (11/09/2008)   TG Goal: 150 (11/09/2008)  Prior 10 Yr Risk Heart Disease: 14 % (06/01/2009)   HDL:29 (10/16/2010), 26 (03/13/2010)  LDL:93 (10/16/2010), 57 (03/13/2010)  Chol:134  (10/16/2010), 97 (03/13/2010)  Trig:61 (10/16/2010), 72 (03/13/2010)  Problem # 2:  HYPERTENSION (ICD-401.9) Assessment: Unchanged  The following medications were removed from the medication list:    Lisinopril 20 Mg Tabs (Lisinopril) .Marland Kitchen... Take 1 tablet by mouth once a day    Atenolol 25 Mg Tabs (Atenolol) .Marland Kitchen... Take 1 tablet by mouth once a day dose change His updated medication list for this problem includes:    Atenolol 25 Mg Tabs (Atenolol) .Marland Kitchen... Take 1 tablet by mouth once daily    Adalat Cc 60 Mg Xr24h-tab (Nifedipine) ..... One daily    Lisinopril 20 Mg Tabs (Lisinopril) .Marland Kitchen... 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 One two times a day 2)  Atenolol 25 Mg Tabs (Atenolol) .... Take 1 tablet by mouth once daily 3)  Simvastatin 20 Mg Tabs (Simvastatin) .... One daily 4)  Garlic 1000 Mg Caps (Garlic) .... One daily 5)  Fish Oil 1200 Mg Caps (Omega-3 fatty acids) .... One three times a day 6)  Multivitamins Caps (Multiple vitamin) .... One daily 7)  Bayer Aspirin 325 Mg Tabs (Aspirin) .... One daily 8)  Vitamin D3 400 Unit Tabs (Cholecalciferol) .... 5 pills daily 9)  Adalat Cc 60 Mg Xr24h-tab (Nifedipine) .... One daily 10)  Power Wheelchair  .... Use as directed 11)  Tylenol Extra Strength 500 Mg Tabs (Acetaminophen) .... 2 tabs by mouth once daily 12)  Antacid Double Strength 700-300 Mg Chew (Ca carbonate-mag hydroxide) .... As needed 13)  Lisinopril 20 Mg Tabs (Lisinopril) ....  Take 1 tablet by mouth once daily  Other Orders: T- Hemoglobin A1C ([id number]) T-PSA (24401-02725)  Patient Instructions: 1)  Follow up appointment in 5.41months  for a CPE 2)  It is important that you exercise regularly at least 20 minutes 5 times a week. If you develop chest pain, have severe difficulty breathing, or feel very tired , stop exercising immediately and seek medical attention. 3)  You need to lose weight. Consider a lower calorie diet and regular exercise.  4)  BMP prior to visit, ICD-9: 5)  Hepatic Panel prior to visit, ICD-9: 6)  Lipid Panel prior to visit, ICD-9:  fasting in 5.5 months. 7)  PSA, and HBA1C 8)  Pls reduce your sugar intake , so you do not become diabetic Prescriptions: LISINOPRIL 20 MG TABS (LISINOPRIL) take 1 tablet by mouth once daily  #90 x 1   Entered by:   Adella Hare LPN   Authorized by:   Syliva Overman MD   Signed by:  Adella Hare LPN on 91/47/8295   Method used:   Faxed to ...       [facility] (mail-order)             , [place]         Ph: [phone]       Fax: [phone]   RxID:   [id number] ADALAT CC 60 MG XR24H-TAB (NIFEDIPINE) One daily  #90 x 1   Entered by:   Adella Hare LPN   Authorized by:   Syliva Overman MD   Signed by:   Adella Hare LPN on 40/34/7425   Method used:   Faxed to ...       [facility] (mail-order)             , [place]         Ph: [phone]       Fax: [phone]   RxID:   [id number] SIMVASTATIN 20 MG TABS (SIMVASTATIN) One daily  #90 x 1   Entered by:   Adella Hare LPN   Authorized by:   Syliva Overman MD   Signed by:   Adella Hare LPN on 73/22/0254   Method used:   Faxed to ...       [facility] (mail-order)             , [place]         Ph: [phone]       Fax: [phone]   RxID:   [id number] ATENOLOL 25 MG TABS (ATENOLOL) take 1 tablet by mouth once daily  #90 x 1   Entered by:   Adella Hare LPN   Authorized by:   Syliva Overman MD   Signed by:    Adella Hare LPN on 35/00/9381   Method used:   Faxed to ...       [facility] (mail-order)             , [place]         Ph: [phone]       Fax: [phone]   RxID:   [id number] LISINOPRIL 20 MG TABS (LISINOPRIL) take 1 tablet by mouth once daily  #30 x 3   Entered by:   Adella Hare LPN   Authorized by:   Syliva Overman MD   Signed by:   Adella Hare LPN on 61/44/3154   Method used:   Electronically to        [facility]  [address] (retail)       [address]       [place], [place]  [address]       Ph: [phone]       Fax: [phone]   RxID:   [id number] ADALAT CC 60 MG XR24H-TAB (NIFEDIPINE) One daily  #90 x 1   Entered by:   Adella Hare LPN   Authorized by:   Syliva Overman MD   Signed by:   Adella Hare LPN on 79/11/4095   Method used:   Electronically to        [facility]  [address] (retail)       [address]       [place], [place]  [address]       Ph: [phone]       Fax: [phone]   RxID:   [id number] SIMVASTATIN 20 MG TABS (SIMVASTATIN) One daily  #90 x 1  Entered by:   Adella Hare LPN   Authorized by:   Syliva Overman MD   Signed by:   Adella Hare LPN on 04/54/0981   Method used:   Electronically to        [facility]  [address] (retail)       [address]       [place], [place]  [address]       Ph: [phone]       Fax: [phone]   RxID:   [id number] ATENOLOL 25 MG TABS (ATENOLOL) take 1 tablet by mouth once daily  #30 x 3   Entered by:   Adella Hare LPN   Authorized by:   Syliva Overman MD   Signed by:   Adella Hare LPN on 36/64/4034   Method used:   Electronically to        [facility]  [address] (retail)       [address]       [place], [place]  [address]       Ph: [phone]       Fax: [phone]   RxID:   [id number]    Orders Added: 1)  Est. Patient Level IV [32202] 2)  Medicare Electronic Prescription [G8553] 3)   T-Basic Metabolic Panel [80048-22910] 4)  T-Hepatic Function [80076-22960] 5)  T-Lipid Profile [80061-22930] 6)  T- Hemoglobin A1C [83036-23375] 7)  T-PSA [54270-62376]

## 2010-12-24 LAB — URINE MICROSCOPIC-ADD ON

## 2010-12-24 LAB — BASIC METABOLIC PANEL
CO2: 23 mEq/L (ref 19–32)
Calcium: 8.7 mg/dL (ref 8.4–10.5)
Chloride: 110 mEq/L (ref 96–112)
Creatinine, Ser: 2.04 mg/dL — ABNORMAL HIGH (ref 0.4–1.5)
GFR calc non Af Amer: 33 mL/min — ABNORMAL LOW (ref >60)
Potassium: 3.5 mEq/L (ref 3.5–5.1)
Sodium: 141 mEq/L (ref 135–145)

## 2010-12-24 LAB — CBC
HCT: 40.7 % (ref 39.0–52.0)
Hemoglobin: 13.7 g/dL (ref 13.0–17.0)
RDW: 12.8 % (ref 11.5–15.5)

## 2010-12-24 LAB — COMPREHENSIVE METABOLIC PANEL
ALT: 19 U/L (ref 0–53)
AST: 22 U/L (ref 0–37)
Alkaline Phosphatase: 112 U/L (ref 39–117)
BUN: 56 mg/dL — ABNORMAL HIGH (ref 6–23)
CO2: 22 mEq/L (ref 19–32)
Calcium: 9.2 mg/dL (ref 8.4–10.5)
Creatinine, Ser: 2.22 mg/dL — ABNORMAL HIGH (ref 0.4–1.5)
GFR calc non Af Amer: 30 mL/min — ABNORMAL LOW (ref >60)

## 2010-12-24 LAB — URINALYSIS, ROUTINE W REFLEX MICROSCOPIC
Bilirubin Urine: NEGATIVE
Ketones, ur: NEGATIVE mg/dL
Leukocytes, UA: NEGATIVE
Protein, ur: NEGATIVE mg/dL

## 2010-12-24 LAB — DIFFERENTIAL
Basophils Relative: 0 % (ref 0–1)
Lymphs Abs: 1.6 10*3/uL (ref 0.7–4.0)

## 2011-01-01 ENCOUNTER — Telehealth: Payer: Self-pay | Admitting: Family Medicine

## 2011-01-02 NOTE — Telephone Encounter (Signed)
pls enter changed prescription, discontinue nifedipine and new script for amlodipine 10mg  one daily #90 refill zero, he needs ov in 2 to 3 months also

## 2011-01-04 MED ORDER — AMLODIPINE BESYLATE 10 MG PO TABS: 10.0000 mg | ORAL_TABLET | Freq: Every day | ORAL | Status: DC

## 2011-01-15 ENCOUNTER — Other Ambulatory Visit: Payer: Self-pay | Admitting: Family Medicine

## 2011-02-19 NOTE — Assessment & Plan Note (Signed)
NAMEMarland Kitchen  IMAN, OROURKE               CHART#:  [id number]   DATE:  12/06/2008                       DOB:  20-Jul-1941   REFERRING PHYSICIAN:  Franchot Heidelberg, M.D.   REASON FOR CONSULTATION:  History of colonic polyps.  Needs surveillance  colonoscopy.   HISTORY OF PRESENT ILLNESS:  Mr. Sujay Grundman is a very pleasant 70-  year-old Caucasian male sent over as courtesy by Dr. Adam Phenix  for consideration of a surveillance colonoscopy.  Mr. Kornegay describes  having a colonoscopy done in [place] back in 2003. Two nonmalignant  polyps were removed.  He was told to come back at some point before he  moved to [place].  At any rate he has not had a followup exam now  7 years out.  He is currently not having any lower GI tract symptoms  whatsoever.  He has not had any abdominal pain.  No upper GI tract  symptoms such as odynophagia, dysphagia, early satiety, reflux symptoms,  nausea, vomiting.  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  He has by his  report high-grade left carotid stenosis.  He does take aspirin daily.   PAST MEDICAL HISTORY:  1. Peripheral vascular disease.  2. Hypertension.  3. Hypercholesterolemia.  4. Osteoarthritis.   PAST SURGICAL HISTORY:  He had a sigmoid resection of his colon in 1963  for diverticulitis?  Status post tonsillectomy, adenoidectomy.  Prior  colonoscopy as outlined above.   CURRENT MEDICATIONS:  1. Atenolol 50 mg daily.  2. Gemfibrozil 600 mg b.i.d.  3. Lisinopril 20 mg daily.  4. Nifediac daily.  5. Simvastatin 40 grams daily.  6. Aspirin 325 mg daily.  7. Fish oil t.i.d.  8  Garlic daily.  1. Multivitamin daily.  2. Vitamin D daily.   ALLERGIES:  No known drug allergies.   FAMILY HISTORY:  Mother died age 46 of breast cancer.  Father at age 78  with a myocardial infarction.   SOCIAL HISTORY:  The patient is divorced.  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  No cigarettes since 1995.  No alcohol consumption  whatsoever since 1983.  No illicit drugs.   REVIEW OF SYSTEMS:  No recent chest pain or dyspnea on exertion.  No  fever, chills.  Otherwise as in history of present illness.   PHYSICAL EXAMINATION:  Pleasant 70 year old gentleman who ambulates with  a quad cane.  He appears in no acute distress.  Weight 210, height 5  feet 9 inches.  Temperature 97.8, BP 110/70, pulse 64.  SKIN:  Warm and dry.  There is no jaundice or changes consistent with  stigmata of chronic liver disease.  HEENT EXAM:  No scleral icterus.  Conjunctivae pink.  CHEST:  Lungs are clear to auscultation.  CARDIAC EXAM:  Regular rate and rhythm without murmur, gallop, rub.  ABDOMEN:  Nondistended, positive bowel sounds.  He does have diastases  present.  Entirely soft, nontender without appreciable mass or  splenomegaly.  EXTREMITIES:  No edema.  RECTAL EXAM:  Deferred until time of colonoscopy.   IMPRESSION:  Mr. Marico Buckle is a very pleasant 70 year old  gentleman with a history of colonic polyps removed back in 2003.  I  would  agree with Dr. Lyndon Code it seems reasonable  he needs to have a  colonoscopy at this time.  I discussed this approach with Mr. Schliep.  The risks, benefits, limitations and alternatives have been reviewed.  His questions were answered.  He is agreeable.  We will plan to perform  Mr. Mckenna's colonoscopy in the very near future at [facility].  I will allow him to stay on aspirin.  Further recommendations  to follow.   I would like to thank Dr. Lyndon Code for allowing me to see this very nice  gentleman today.       Jonathon Bellows, M.D.  Electronically Signed     RMR/MEDQ  D:  12/06/2008  T:  12/06/2008  Job:  [id number]   cc:   Franchot Heidelberg, M.D.

## 2011-02-19 NOTE — Op Note (Signed)
NAME:  Randy Garrett, Randy Garrett            ACCOUNT NO.:  [id number]   MEDICAL RECORD NO.:  [id number]          PATIENT TYPE:  AMB   LOCATION:  DAY                           FACILITY:  [facility]   PHYSICIAN:  R. Roetta Sessions, M.D. DATE OF BIRTH:  1940-10-27   DATE OF PROCEDURE:  12/20/2008  DATE OF DISCHARGE:                               OPERATIVE REPORT   PROCEDURE PERFORMED:  Surveillance colonoscopy.   INDICATIONS FOR PROCEDURE:  A 70 year old gentleman history of colonic  polyps removed in [place] several years ago.  He is due for follow-  up.  He is not having any lower GI tract symptoms at this time.  There  is no family history of colorectal neoplasia or polyps.  Colonoscopy is  now being done as surveillance maneuver.  Risks, benefits, alternatives  and limitations have been reviewed, questions answered.  He is  agreeable.  Please see documentation in the medical record.   PROCEDURE NOTE:  O2 saturation, blood pressure, pulse and respirations  were monitored throughout the entire procedure.   CONSCIOUS SEDATION:  Versed 2 mg IV and Demerol 50 mg IV in single dose.   INSTRUMENT:  Pentax video chip system.   ENDOSCOPIC FINDINGS:  Digital rectal exam revealed no abnormalities.  The prep was good.  Colon:  Colonic mucosa was surveyed from the rectosigmoid junction  through the left, transverse, right colon to the area of the appendiceal  orifice, ileocecal valve and cecum where these structures were well seen  photographed for the record.  From this level the scope was slowly and  cautiously withdrawn.  All previously mentioned mucosal surfaces were  again seen.  The colonic mucosa appeared normal.  The scope was pulled  down to the rectum.  A thorough examination of the rectal mucosa  including retroflex view of the anal verge demonstrated no  abnormalities.  The patient tolerated the procedure well, was reacted in  endoscopy.  Cecal withdrawal time 7 minutes.   IMPRESSION:  1.  Normal rectum.  2. Normal colon.   RECOMMENDATIONS:  Repeat colonoscopy 5 years.     Jonathon Bellows, M.D.  Electronically Signed    RMR/MEDQ  D:  12/20/2008  T:  12/20/2008  Job:  [id number]

## 2011-02-26 ENCOUNTER — Other Ambulatory Visit: Payer: Self-pay | Admitting: Family Medicine

## 2011-02-26 LAB — BASIC METABOLIC PANEL
Calcium: 9.5 mg/dL (ref 8.4–10.5)
Potassium: 4.1 mEq/L (ref 3.5–5.3)

## 2011-02-26 LAB — LIPID PANEL
HDL: 30 mg/dL — ABNORMAL LOW (ref >39)
Total CHOL/HDL Ratio: 4.1 Ratio
Triglycerides: 42 mg/dL (ref <150)

## 2011-02-26 LAB — HEPATIC FUNCTION PANEL
Bilirubin, Direct: 0.2 mg/dL (ref 0.0–0.3)
Total Protein: 7.3 g/dL (ref 6.0–8.3)

## 2011-02-26 LAB — PSA: PSA: 1.23 ng/mL (ref <=4.00)

## 2011-02-26 LAB — HEMOGLOBIN A1C: Mean Plasma Glucose: 111 mg/dL (ref <117)

## 2011-03-14 ENCOUNTER — Encounter: Payer: Self-pay | Admitting: Family Medicine

## 2011-03-18 ENCOUNTER — Ambulatory Visit (INDEPENDENT_AMBULATORY_CARE_PROVIDER_SITE_OTHER): Payer: Medicare HMO | Admitting: Family Medicine

## 2011-03-18 ENCOUNTER — Encounter: Payer: Self-pay | Admitting: Family Medicine

## 2011-03-18 VITALS — BP 140/70 | HR 64 | Resp 16 | Ht 69.0 in | Wt 212.8 lb

## 2011-03-18 DIAGNOSIS — Z23 Encounter for immunization: Secondary | ICD-10-CM

## 2011-03-18 DIAGNOSIS — H547 Unspecified visual loss: Secondary | ICD-10-CM

## 2011-03-18 DIAGNOSIS — Z1211 Encounter for screening for malignant neoplasm of colon: Secondary | ICD-10-CM

## 2011-03-18 DIAGNOSIS — I10 Essential (primary) hypertension: Secondary | ICD-10-CM

## 2011-03-18 DIAGNOSIS — E669 Obesity, unspecified: Secondary | ICD-10-CM

## 2011-03-18 DIAGNOSIS — Z0000 Encounter for general adult medical examination without abnormal findings: Secondary | ICD-10-CM

## 2011-03-18 DIAGNOSIS — E785 Hyperlipidemia, unspecified: Secondary | ICD-10-CM

## 2011-03-18 MED ORDER — LISINOPRIL 20 MG PO TABS: 20.0000 mg | ORAL_TABLET | Freq: Every day | ORAL | Status: DC

## 2011-03-18 MED ORDER — GEMFIBROZIL 600 MG PO TABS: 600.0000 mg | ORAL_TABLET | Freq: Two times a day (BID) | ORAL | Status: DC

## 2011-03-18 MED ORDER — SIMVASTATIN 20 MG PO TABS: 20.0000 mg | ORAL_TABLET | Freq: Every day | ORAL | Status: DC

## 2011-03-18 MED ORDER — ATENOLOL 25 MG PO TABS: 25.0000 mg | ORAL_TABLET | Freq: Every day | ORAL | Status: DC

## 2011-03-18 NOTE — Patient Instructions (Addendum)
F/u in4 months.  cBC , fasting lipid and hepatic panel,  in4 months.  You need to sched an appt with opthalmology, your vision is very poor in your left eye.  TdAP today

## 2011-03-31 NOTE — Assessment & Plan Note (Signed)
Pt to schedule eye exam °

## 2011-03-31 NOTE — Assessment & Plan Note (Signed)
Controlled, no change in medication Though HDL is low, pt's ability to exercise is severely limited

## 2011-03-31 NOTE — Assessment & Plan Note (Signed)
Deteriorated. Patient re-educated about  the importance of commitment to a  minimum of 150 minutes of exercise per week. The importance of healthy food choices with portion control discussed. Encouraged to start a food diary, count calories and to consider  joining a support group. Sample diet sheets offered. Goals set by the patient for the next several months.    

## 2011-03-31 NOTE — Assessment & Plan Note (Signed)
Controlled, no change in medication  

## 2011-03-31 NOTE — Progress Notes (Signed)
  Subjective:    Patient ID: Randy Garrett, male    DOB: Jan 10, 1941, 70 y.o.   MRN: [id number]  HPI The PT is here for annual exam  and re-evaluation of chronic medical conditions, medication management and review of recent lab and radiology data.  Preventive health is updated, specifically  Cancer screening,  and Immunization.   Questions or concerns regarding consultations or procedures which the PT has had in the interim are  addressed. The PT denies any adverse reactions to current medications since the last visit.  There are no new concerns.  There are no specific complaints       Review of Systems    Denies recent fever or chills. Denies sinus pressure, nasal congestion, ear pain or sore throat. Denies chest congestion, productive cough or wheezing. Denies chest pains, palpitations, paroxysmal nocturnal dyspnea, orthopnea and leg swelling Denies abdominal pain, nausea, vomiting,diarrhea or constipation.  Denies rectal bleeding or change in bowel movement. Denies dysuria, frequency, hesitancy or incontinence.  Denies headaches, seizure, numbness, or tingling. Denies depression, anxiety or insomnia. Denies skin break down or rash.     Objective:   Physical Exam Pleasant well nourished male, alert and oriented x 3, in no cardio-pulmonary distress. Afebrile. HEENT No facial trauma or asymetry.  No sinus tenderness EOMI, PERTL, fundoscopic exam is negative for hemorhages or exudates. External ears normal, tympanic membranes clear. Oropharynx moist, no exudate, good dentition. Neck: supple, no adenopathy,JVD or thyromegaly.No bruits.  Chest: Clear to ascultation bilaterally.No crackles or wheezes. Non tender to palpation  Breast: No asymetry,no masses. No nipple discharge or inversion. No axillary or supraclavicular adenopathy  Cardiovascular system; Heart sounds normal,  S1 and  S2 ,no S3.  No murmur, or thrill. Apical beat not displaced Peripheral pulses  normal.  Abdomen: Soft, non tender, no organomegaly or masses. No bruits. Bowel sounds normal. No guarding, tenderness or rebound.  Rectal:  No mass. guaic negative stool. Prostate smooth and firm  GU: No penile lesion or discharge. No testicular mass.  Musculoskeletal exam: Decreased  ROM of spine, hips , and knees.Pt uses assistive dvice No deformity ,swelling or crepitus noted. No muscle wasting or atrophy.   Neurologic: Cranial nerves 2 to 12 intact.Markedly reduced vision in left eye Power, tone ,sensation and reflexes normal throughout. Abnormal  gait. No tremor.  Skin: Intact, no ulceration, erythema , scaling or rash noted. Pigmentation normal throughout  Psych; Normal mood and affect. Judgement and concentration normal         Assessment & Plan:

## 2011-04-11 ENCOUNTER — Encounter: Payer: Self-pay | Admitting: Family Medicine

## 2011-06-20 LAB — LIPID PANEL
Cholesterol: 108 mg/dL (ref 0–200)
Total CHOL/HDL Ratio: 3.7 Ratio
Triglycerides: 47 mg/dL (ref <150)
VLDL: 9 mg/dL (ref 0–40)

## 2011-06-20 LAB — HEPATIC FUNCTION PANEL
Indirect Bilirubin: 0.4 mg/dL (ref 0.0–0.9)
Total Bilirubin: 0.6 mg/dL (ref 0.3–1.2)

## 2011-07-18 ENCOUNTER — Encounter: Payer: Self-pay | Admitting: Family Medicine

## 2011-07-22 ENCOUNTER — Ambulatory Visit (INDEPENDENT_AMBULATORY_CARE_PROVIDER_SITE_OTHER): Payer: Medicare HMO | Admitting: Family Medicine

## 2011-07-22 ENCOUNTER — Encounter: Payer: Self-pay | Admitting: Family Medicine

## 2011-07-22 DIAGNOSIS — Z23 Encounter for immunization: Secondary | ICD-10-CM

## 2011-07-22 DIAGNOSIS — I10 Essential (primary) hypertension: Secondary | ICD-10-CM

## 2011-07-22 DIAGNOSIS — E785 Hyperlipidemia, unspecified: Secondary | ICD-10-CM

## 2011-07-22 DIAGNOSIS — Z1211 Encounter for screening for malignant neoplasm of colon: Secondary | ICD-10-CM

## 2011-07-22 DIAGNOSIS — R5383 Other fatigue: Secondary | ICD-10-CM

## 2011-07-22 DIAGNOSIS — E669 Obesity, unspecified: Secondary | ICD-10-CM

## 2011-07-22 DIAGNOSIS — E049 Nontoxic goiter, unspecified: Secondary | ICD-10-CM

## 2011-07-22 LAB — POC HEMOCCULT BLD/STL (OFFICE/1-CARD/DIAGNOSTIC): Fecal Occult Blood, POC: NEGATIVE

## 2011-07-22 MED ORDER — INFLUENZA VAC TYPES A & B PF IM SUSP: 0.5000 mL | Freq: Once | INTRAMUSCULAR | Status: DC

## 2011-07-22 NOTE — Assessment & Plan Note (Signed)
Low HDL , otherwise good, continue meds and start regular exercise

## 2011-07-22 NOTE — Assessment & Plan Note (Signed)
Controlled, no change in medication  

## 2011-07-22 NOTE — Patient Instructions (Addendum)
F/u in 4 months  Fasting labs 1 week before next visit.  pls cut back on ice cream and start regular exercise, you have promised to lose 5 pounds!  Flu vaccine today.  Pls call if you get sick  A healthy diet is rich in fruit, vegetables and whole grains. Poultry fish, nuts and beans are a healthy choice for protein rather then red meat. A low sodium diet and drinking 64 ounces of water daily is generally recommended. Oils and sweet should be limited. Carbohydrates especially for those who are diabetic or overweight, should be limited to 30-45 gram per meal. It is important to eat on a regular schedule, at least 3 times daily. Snacks should be primarily fruits, vegetables or nuts.  It is important that you exercise regularly at least 30 minutes 5 times a week. If you develop chest pain, have severe difficulty breathing, or feel very tired, stop exercising immediately and seek medical attention

## 2011-07-22 NOTE — Assessment & Plan Note (Signed)
Deteriorated. Patient re-educated about  the importance of commitment to a  minimum of 150 minutes of exercise per week. The importance of healthy food choices with portion control discussed. Encouraged to start a food diary, count calories and to consider  joining a support group. Sample diet sheets offered. Goals set by the patient for the next several months.    

## 2011-07-28 NOTE — Progress Notes (Signed)
  Subjective:    Patient ID: Randy Garrett, male    DOB: 11-18-1940, 70 y.o.   MRN: [id number]  HPI The PT is here for follow up and re-evaluation of chronic medical conditions, medication management and review of any available recent lab and radiology data.  Preventive health is updated, specifically  Cancer screening and Immunization.    The PT denies any adverse reactions to current medications since the last visit.  There are no new concerns.  There are no specific complaints       Review of Systems See HPI Denies recent fever or chills. Denies sinus pressure, nasal congestion, ear pain or sore throat. Denies chest congestion, productive cough or wheezing. Denies chest pains, palpitations and leg swelling Denies abdominal pain, nausea, vomiting,diarrhea or constipation.   Denies dysuria, frequency, hesitancy or incontinence.  Denies headaches, seizures, numbness, or tingling. Denies depression, anxiety or insomnia. Denies skin break down or rash.        Objective:   Physical Exam  Patient alert and oriented and in no cardiopulmonary distress.  HEENT: No facial asymmetry, EOMI, no sinus tenderness,  oropharynx pink and moist.  Neck supple no adenopathy.  Chest: Clear to auscultation bilaterally.  CVS: S1, S2 no murmurs, no S3.  ABD: Soft non tender. Bowel sounds normal.  Ext: No edema  WU:[id number]  ROM spine, shoulders, hips and knees.  Skin: Intact, no ulcerations or rash noted.  Psych: Good eye contact, normal affect. Memory intact not anxious or depressed appearing.  CNS: CN 2-12 intact, power, tone and sensation normal throughout.       Assessment & Plan:

## 2011-08-14 ENCOUNTER — Other Ambulatory Visit: Payer: Self-pay

## 2011-08-14 MED ORDER — AMLODIPINE BESYLATE 10 MG PO TABS: 10.0000 mg | ORAL_TABLET | Freq: Every day | ORAL | Status: DC

## 2011-08-14 MED ORDER — SIMVASTATIN 20 MG PO TABS: 20.0000 mg | ORAL_TABLET | Freq: Every day | ORAL | Status: DC

## 2011-08-14 MED ORDER — LISINOPRIL 20 MG PO TABS: 20.0000 mg | ORAL_TABLET | Freq: Every day | ORAL | Status: DC

## 2011-08-14 MED ORDER — ATENOLOL 25 MG PO TABS: 25.0000 mg | ORAL_TABLET | Freq: Every day | ORAL | Status: DC

## 2011-11-19 LAB — CBC WITH DIFFERENTIAL/PLATELET
Basophils Absolute: 0 10*3/uL (ref 0.0–0.1)
Eosinophils Relative: 5 % (ref 0–5)
HCT: 47.8 % (ref 39.0–52.0)
Hemoglobin: 15.6 g/dL (ref 13.0–17.0)
Lymphocytes Relative: 21 % (ref 12–46)
Lymphs Abs: 1.5 10*3/uL (ref 0.7–4.0)
MCV: 97.2 fL (ref 78.0–100.0)
Monocytes Absolute: 0.9 10*3/uL (ref 0.1–1.0)
Neutro Abs: 4.2 10*3/uL (ref 1.7–7.7)
RBC: 4.92 MIL/uL (ref 4.22–5.81)
RDW: 12.9 % (ref 11.5–15.5)
WBC: 7 10*3/uL (ref 4.0–10.5)

## 2011-11-19 LAB — HEPATIC FUNCTION PANEL
ALT: 12 U/L (ref 0–53)
Alkaline Phosphatase: 115 U/L (ref 39–117)
Bilirubin, Direct: 0.2 mg/dL (ref 0.0–0.3)
Indirect Bilirubin: 0.5 mg/dL (ref 0.0–0.9)

## 2011-11-25 ENCOUNTER — Encounter: Payer: Self-pay | Admitting: Family Medicine

## 2011-11-25 ENCOUNTER — Ambulatory Visit (INDEPENDENT_AMBULATORY_CARE_PROVIDER_SITE_OTHER): Payer: Medicare Other | Admitting: Family Medicine

## 2011-11-25 VITALS — BP 158/88 | HR 61 | Resp 16 | Ht 69.0 in | Wt 208.0 lb

## 2011-11-25 DIAGNOSIS — I10 Essential (primary) hypertension: Secondary | ICD-10-CM

## 2011-11-25 DIAGNOSIS — E785 Hyperlipidemia, unspecified: Secondary | ICD-10-CM

## 2011-11-25 DIAGNOSIS — M129 Arthropathy, unspecified: Secondary | ICD-10-CM

## 2011-11-25 MED ORDER — LISINOPRIL 20 MG PO TABS: 20.0000 mg | ORAL_TABLET | Freq: Every day | ORAL | Status: DC

## 2011-11-25 MED ORDER — ATENOLOL 25 MG PO TABS: 25.0000 mg | ORAL_TABLET | Freq: Every day | ORAL | Status: DC

## 2011-11-25 MED ORDER — AMLODIPINE BESYLATE 10 MG PO TABS: 10.0000 mg | ORAL_TABLET | Freq: Every day | ORAL | Status: DC

## 2011-11-25 MED ORDER — GEMFIBROZIL 600 MG PO TABS: 600.0000 mg | ORAL_TABLET | Freq: Two times a day (BID) | ORAL | Status: DC

## 2011-11-25 MED ORDER — SIMVASTATIN 20 MG PO TABS: 20.0000 mg | ORAL_TABLET | Freq: Every day | ORAL | Status: DC

## 2011-11-25 NOTE — Assessment & Plan Note (Signed)
Pt now in exercise program which will improve his symptoms

## 2011-11-25 NOTE — Progress Notes (Signed)
  Subjective:    Patient ID: Randy Garrett, male    DOB: Jan 23, 1941, 71 y.o.   MRN: [id number]  HPI The PT is here for follow up and re-evaluation of chronic medical conditions, medication management and review of any available recent lab and radiology data.  Preventive health is updated, specifically  Cancer screening and Immunization.   Questions or concerns regarding consultations or procedures which the PT has had in the interim are  addressed. The PT denies any adverse reactions to current medications since the last visit.  There are no new concerns.  There are no specific complaints  Pt has worked succesfully on weight loss through lifestyle change      Review of Systems See HPI Denies recent fever or chills. Denies sinus pressure, nasal congestion, ear pain or sore throat. Denies chest congestion, productive cough or wheezing. Denies chest pains, palpitations and leg swelling Denies abdominal pain, nausea, vomiting,diarrhea or constipation.   Denies dysuria, frequency, hesitancy or incontinence. Denies disabling  joint pain, swelling and he ambulates with a cane. Denies ant fallsDenies headaches, seizures, numbness, or tingling. Denies depression, anxiety or insomnia. Denies skin break down or rash.        Objective:   Physical Exam        Assessment & Plan:

## 2011-11-25 NOTE — Assessment & Plan Note (Signed)
Hyperlipidemia:Low fat diet discussed and encouraged.  Continue meds and rept lab in 4 month

## 2011-11-25 NOTE — Patient Instructions (Addendum)
F/U in 4 .5 month   Congrats on weight loss, and please continue at the [facility] Aim for another 5 pounds off   No med changes, your labs are great  Continue the humor  Fasting labs before next visit, and call if you need me before please

## 2011-11-25 NOTE — Assessment & Plan Note (Signed)
Controlled, no change in medication  

## 2012-04-14 ENCOUNTER — Encounter (HOSPITAL_COMMUNITY): Payer: Self-pay | Admitting: *Deleted

## 2012-04-14 ENCOUNTER — Inpatient Hospital Stay (HOSPITAL_COMMUNITY)
Admission: EM | Admit: 2012-04-14 | Discharge: 2012-04-20 | DRG: 683 | Disposition: A | Discharge status: Home Health | Payer: Medicare Other | Attending: Internal Medicine | Admitting: Internal Medicine

## 2012-04-14 DIAGNOSIS — Z87891 Personal history of nicotine dependence: Secondary | ICD-10-CM

## 2012-04-14 DIAGNOSIS — E872 Acidosis, unspecified: Secondary | ICD-10-CM | POA: Diagnosis present

## 2012-04-14 DIAGNOSIS — N318 Other neuromuscular dysfunction of bladder: Secondary | ICD-10-CM

## 2012-04-14 DIAGNOSIS — N179 Acute kidney failure, unspecified: Principal | ICD-10-CM

## 2012-04-14 DIAGNOSIS — Q618 Other cystic kidney diseases: Secondary | ICD-10-CM

## 2012-04-14 DIAGNOSIS — E785 Hyperlipidemia, unspecified: Secondary | ICD-10-CM

## 2012-04-14 DIAGNOSIS — M129 Arthropathy, unspecified: Secondary | ICD-10-CM

## 2012-04-14 DIAGNOSIS — R5383 Other fatigue: Secondary | ICD-10-CM

## 2012-04-14 DIAGNOSIS — D649 Anemia, unspecified: Secondary | ICD-10-CM | POA: Diagnosis present

## 2012-04-14 DIAGNOSIS — R011 Cardiac murmur, unspecified: Secondary | ICD-10-CM

## 2012-04-14 DIAGNOSIS — H547 Unspecified visual loss: Secondary | ICD-10-CM

## 2012-04-14 DIAGNOSIS — N200 Calculus of kidney: Secondary | ICD-10-CM

## 2012-04-14 DIAGNOSIS — R9431 Abnormal electrocardiogram [ECG] [EKG]: Secondary | ICD-10-CM

## 2012-04-14 DIAGNOSIS — I129 Hypertensive chronic kidney disease with stage 1 through stage 4 chronic kidney disease, or unspecified chronic kidney disease: Secondary | ICD-10-CM | POA: Diagnosis present

## 2012-04-14 DIAGNOSIS — N183 Chronic kidney disease, stage 3 unspecified: Secondary | ICD-10-CM | POA: Diagnosis present

## 2012-04-14 DIAGNOSIS — I359 Nonrheumatic aortic valve disorder, unspecified: Secondary | ICD-10-CM | POA: Diagnosis present

## 2012-04-14 DIAGNOSIS — I2510 Atherosclerotic heart disease of native coronary artery without angina pectoris: Secondary | ICD-10-CM

## 2012-04-14 DIAGNOSIS — E669 Obesity, unspecified: Secondary | ICD-10-CM

## 2012-04-14 DIAGNOSIS — E876 Hypokalemia: Secondary | ICD-10-CM | POA: Diagnosis present

## 2012-04-14 DIAGNOSIS — N201 Calculus of ureter: Secondary | ICD-10-CM | POA: Diagnosis present

## 2012-04-14 DIAGNOSIS — N2889 Other specified disorders of kidney and ureter: Secondary | ICD-10-CM

## 2012-04-14 DIAGNOSIS — E860 Dehydration: Secondary | ICD-10-CM

## 2012-04-14 DIAGNOSIS — M25569 Pain in unspecified knee: Secondary | ICD-10-CM

## 2012-04-14 DIAGNOSIS — E049 Nontoxic goiter, unspecified: Secondary | ICD-10-CM

## 2012-04-14 DIAGNOSIS — R5381 Other malaise: Secondary | ICD-10-CM

## 2012-04-14 DIAGNOSIS — N2581 Secondary hyperparathyroidism of renal origin: Secondary | ICD-10-CM | POA: Diagnosis present

## 2012-04-14 DIAGNOSIS — Z8673 Personal history of transient ischemic attack (TIA), and cerebral infarction without residual deficits: Secondary | ICD-10-CM

## 2012-04-14 DIAGNOSIS — Z87442 Personal history of urinary calculi: Secondary | ICD-10-CM

## 2012-04-14 DIAGNOSIS — I10 Essential (primary) hypertension: Secondary | ICD-10-CM

## 2012-04-14 LAB — LIPID PANEL
LDL Cholesterol: 69 mg/dL (ref 0–99)
Total CHOL/HDL Ratio: 3.2 Ratio
Triglycerides: 86 mg/dL (ref <150)
VLDL: 17 mg/dL (ref 0–40)

## 2012-04-14 LAB — COMPREHENSIVE METABOLIC PANEL
AST: 22 U/L (ref 0–37)
Albumin: 3.7 g/dL (ref 3.5–5.2)
Alkaline Phosphatase: 100 U/L (ref 39–117)
BUN: 62 mg/dL — ABNORMAL HIGH (ref 6–23)
Chloride: 104 mEq/L (ref 96–112)
Potassium: 3.4 mEq/L — ABNORMAL LOW (ref 3.5–5.1)
Total Bilirubin: 0.3 mg/dL (ref 0.3–1.2)

## 2012-04-14 LAB — CBC WITH DIFFERENTIAL/PLATELET
Basophils Relative: 0 % (ref 0–1)
Hemoglobin: 15.3 g/dL (ref 13.0–17.0)
Lymphs Abs: 1.5 10*3/uL (ref 0.7–4.0)
MCHC: 35.7 g/dL (ref 30.0–36.0)
Monocytes Relative: 14 % — ABNORMAL HIGH (ref 3–12)
Neutro Abs: 9.2 10*3/uL — ABNORMAL HIGH (ref 1.7–7.7)
Neutrophils Relative %: 72 % (ref 43–77)
RBC: 4.77 MIL/uL (ref 4.22–5.81)
WBC: 12.7 10*3/uL — ABNORMAL HIGH (ref 4.0–10.5)

## 2012-04-14 LAB — URINE MICROSCOPIC-ADD ON

## 2012-04-14 LAB — COMPLETE METABOLIC PANEL WITH GFR
ALT: 17 U/L (ref 0–53)
AST: 24 U/L (ref 0–37)
Alkaline Phosphatase: 79 U/L (ref 39–117)
Chloride: 102 mEq/L (ref 96–112)
Creat: 3.25 mg/dL — ABNORMAL HIGH (ref 0.50–1.35)
Total Bilirubin: 0.6 mg/dL (ref 0.3–1.2)

## 2012-04-14 LAB — URINALYSIS, ROUTINE W REFLEX MICROSCOPIC
Bilirubin Urine: NEGATIVE
Glucose, UA: NEGATIVE mg/dL
Ketones, ur: NEGATIVE mg/dL
Protein, ur: NEGATIVE mg/dL

## 2012-04-14 MED ORDER — ATENOLOL 25 MG PO TABS
25.0000 mg | ORAL_TABLET | Freq: Every day | ORAL | Status: DC
  Administered 2012-04-16 – 2012-04-17 (×2): 25 mg via ORAL
  Filled 2012-04-14 (×3): qty 1

## 2012-04-14 MED ORDER — HEPARIN SODIUM (PORCINE) 5000 UNIT/ML IJ SOLN
5000.0000 [IU] | Freq: Three times a day (TID) | INTRAMUSCULAR | Status: DC
  Administered 2012-04-14 – 2012-04-20 (×17): 5000 [IU] via SUBCUTANEOUS
  Filled 2012-04-14 (×23): qty 1

## 2012-04-14 MED ORDER — SODIUM CHLORIDE 0.9 % IV SOLN
Freq: Once | INTRAVENOUS | Status: AC
  Administered 2012-04-14: 17:00:00 via INTRAVENOUS

## 2012-04-14 MED ORDER — SODIUM CHLORIDE 0.9 % IV SOLN: INTRAVENOUS | Status: DC

## 2012-04-14 MED ORDER — ACETAMINOPHEN 325 MG PO TABS
650.0000 mg | ORAL_TABLET | Freq: Every day | ORAL | Status: DC | PRN
  Administered 2012-04-15: 650 mg via ORAL
  Filled 2012-04-14: qty 2

## 2012-04-14 MED ORDER — SODIUM CHLORIDE 0.9 % IV SOLN
INTRAVENOUS | Status: DC
  Administered 2012-04-15 – 2012-04-19 (×10): via INTRAVENOUS

## 2012-04-14 MED ORDER — AMLODIPINE BESYLATE 5 MG PO TABS: 10.0000 mg | ORAL_TABLET | Freq: Every day | ORAL | Status: DC

## 2012-04-14 MED ORDER — ATENOLOL 25 MG PO TABS: 25.0000 mg | ORAL_TABLET | Freq: Every day | ORAL | Status: DC

## 2012-04-14 MED ORDER — AMLODIPINE BESYLATE 5 MG PO TABS
10.0000 mg | ORAL_TABLET | Freq: Every day | ORAL | Status: DC
  Administered 2012-04-16 – 2012-04-20 (×5): 10 mg via ORAL
  Filled 2012-04-14 (×6): qty 2

## 2012-04-14 NOTE — ED Notes (Signed)
Pt presents per PCP secondary to elevated lab values. Pt denies pain  At this time. Dr Judd Lien  At bedside discussing plan of care with family.

## 2012-04-14 NOTE — Plan of Care (Signed)
Problem: Phase II Progression Outcomes Goal: Discharge plan established Outcome: Progressing Plan to d/c back to home with family help

## 2012-04-14 NOTE — H&P (Signed)
LEGION DISCHER MRN: [id number] DOB/AGE: 71/29/1942 71 y.o. Primary Care Physician:Margaret Lodema Hong, MD Admit date: 04/14/2012 Chief Complaint: Acute renal failure. HPI: This very pleasant 71 year old man presents to the hospital because of abnormal lab work done at his primary care physician's office. In terms of symptoms, the patient describes generally not feeling well after he had the pizza a few days ago. He had retching but did not vomit. He did not have diarrhea. However he does believe that whilst he was not feeling well, his fluid intake was reduced. He denies any fever, change in medications. He denies feeling lightheaded or dizzy. He was found to have a creatinine that was elevated and therefore was referred to the emergency room.  Past Medical History  Diagnosis Date  . Hyperlipidemia   . Hypertension   . CAD (coronary artery disease)   . 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     Past Surgical History  Procedure Date  . Tonsillectomy   . Diverticulitis 1964  . Appendectomy         Family History  Problem Relation Age of Onset  . Cancer Mother     breast    Social History:  Patient lives with his daughter. He does not smoke cigarettes since 1995 when he had a stroke. He does not drink alcohol. He is retired.  Allergies: No Known Allergies       WUJ:WJXBJ from the symptoms mentioned above,there are no other symptoms referable to all systems reviewed.  Physical Exam: Blood pressure 141/59, pulse 62, temperature 97.5 F (36.4 C), resp. rate 20, height 5\' 9"  (1.753 m), weight 93.895 kg (207 lb), SpO2 96.00%. He does look systemically well. Possibly slightly dehydrated. Heart sounds are present and normal. There is a fairly loud systolic murmur heard at the apex. Lung fields are clear. Abdomen is soft and nontender. He is alert and orientated without any focal neurological signs.    Basename 04/14/12 1522  WBC 12.7*  NEUTROABS 9.2*  HGB 15.3    HCT 42.9  MCV 89.9  PLT 173    Basename 04/14/12 1522 04/13/12 0700  NA 141 141  K 3.4* 4.1  CL 104 102  CO2 22 25  GLUCOSE 100* 89  BUN 62* 55*  CREATININE 3.11* 3.25*  CALCIUM 9.5 9.0  MG -- --         No results found. Impression: 1. Acute renal failure, likely related to dehydration, compounded by ACE inhibitors. 2. Dehydration. 3. Hypertension.     Plan: 1. Admit to medical floor. 2. Intravenous fluids. 3. Follow renal function. 4. Nephrology consultation. 5. Consider ultrasound of kidneys if his renal function does not improve to look for obstructive uropathy. Further recommendations will depend on patient's hospital progress.      Wilson Singer Pager [phone]  04/14/2012, 5:46 PM

## 2012-04-14 NOTE — ED Provider Notes (Signed)
History   This chart was scribed for Geoffery Lyons, MD by Sofie Rower. The patient was seen in room APA11/APA11 and the patient's care was started at 3:37 PM     CSN: [id number]  Arrival date & time 04/14/12  1353   First MD Initiated Contact with Patient 04/14/12 1535      Chief Complaint  Patient presents with  . Follow-up    (Consider location/radiation/quality/duration/timing/severity/associated sxs/prior treatment) HPI  Randy Garrett is a 71 y.o. male who presents to the Emergency Department complaining of severe, episodic renal failure onset yesterday with associated symptoms of headache. The pt informs the EDP that he had blood work done at [facility] yesterday, 04/13/12. Pt reports that he was in bed for a few days after eating sausage pizza which made him constipated last week. Pt has a hx of acid reflux disease, hypertension, stroke (1995), right leg deficit.   Pt denies kidney problems in the past, dysuria, difficulty urinating, stent placement, diarrhea, vomiting.   PCP is Dr. Lodema Hong.    Past Medical History  Diagnosis Date  . Hyperlipidemia   . Hypertension   . CAD (coronary artery disease)   . Goiter     Past Surgical History  Procedure Date  . Tonsillectomy   . Diverticulitis 1964  . Appendectomy     Family History  Problem Relation Age of Onset  . Cancer Mother     breast    History  Substance Use Topics  . Smoking status: Former Games developer  . Smokeless tobacco: Not on file  . Alcohol Use: No      Review of Systems  All other systems reviewed and are negative.    10 Systems reviewed and all are negative for acute change except as noted in the HPI.    Allergies  Review of patient's allergies indicates no known allergies.  Home Medications   Current Outpatient Rx  Name Route Sig Dispense Refill  . ACETAMINOPHEN 325 MG PO TABS Oral Take 650 mg by mouth daily as needed. pain    . AMLODIPINE BESYLATE 10 MG PO TABS Oral  Take 1 tablet (10 mg total) by mouth daily. Discontinue nifedipine 90 tablet 1  . ATENOLOL 25 MG PO TABS Oral Take 1 tablet (25 mg total) by mouth daily. 90 tablet 1  . GARLIC 1000 MG PO CAPS Oral Take by mouth.      . GEMFIBROZIL 600 MG PO TABS Oral Take 1 tablet (600 mg total) by mouth 2 (two) times daily before a meal. 180 tablet 1  . LISINOPRIL 20 MG PO TABS Oral Take 1 tablet (20 mg total) by mouth daily. 90 tablet 1  . FISH OIL 1000 MG PO CAPS Oral Take 2 capsules by mouth daily.      Marland Kitchen SIMVASTATIN 20 MG PO TABS Oral Take 1 tablet (20 mg total) by mouth at bedtime. 90 tablet 1  . VITAMIN E 400 UNITS PO CAPS Oral Take 800 Units by mouth daily. 2 tabs daily      BP 141/59  Pulse 62  Temp 97.5 F (36.4 C)  Resp 20  Ht 5\' 9"  (1.753 m)  Wt 207 lb (93.895 kg)  BMI 30.57 kg/m2  SpO2 96%  Physical Exam  Nursing note and vitals reviewed. Constitutional: He is oriented to person, place, and time. He appears well-developed and well-nourished.  HENT:  Head: Atraumatic.  Nose: Nose normal.  Cardiovascular: Normal rate, regular rhythm and normal heart sounds.   No  murmur heard. Pulmonary/Chest: Effort normal and breath sounds normal. He has no wheezes.  Abdominal: Soft.  Musculoskeletal: Normal range of motion. He exhibits edema (trace, 1+ edema bilaterally. ).  Neurological: He is alert and oriented to person, place, and time.  Skin: Skin is warm and dry.  Psychiatric: He has a normal mood and affect. His behavior is normal.    ED Course  Procedures (including critical care time)  DIAGNOSTIC STUDIES: Oxygen Saturation is 96% on room air, adequate by my interpretation.    COORDINATION OF CARE:    3:42PM- EDP at bedside discusses treatment plan concerning BUN, creatinine  levels,   3:43PM- EDP at bedside discusses treatment plan concerning evaluation of blood work, IV fluids, evaluation of renal function.    Results for orders placed during the hospital encounter of 04/14/12   CBC WITH DIFFERENTIAL      Component Value Range   WBC 12.7 (*) 4.0 - 10.5 K/uL   RBC 4.77  4.22 - 5.81 MIL/uL   Hemoglobin 15.3  13.0 - 17.0 g/dL   HCT 14.7  82.9 - 56.2 %   MCV 89.9  78.0 - 100.0 fL   MCH 32.1  26.0 - 34.0 pg   MCHC 35.7  30.0 - 36.0 g/dL   RDW 13.0  86.5 - 78.4 %   Platelets 173  150 - 400 K/uL   Neutrophils Relative 72  43 - 77 %   Neutro Abs 9.2 (*) 1.7 - 7.7 K/uL   Lymphocytes Relative 12  12 - 46 %   Lymphs Abs 1.5  0.7 - 4.0 K/uL   Monocytes Relative 14 (*) 3 - 12 %   Monocytes Absolute 1.8 (*) 0.1 - 1.0 K/uL   Eosinophils Relative 2  0 - 5 %   Eosinophils Absolute 0.2  0.0 - 0.7 K/uL   Basophils Relative 0  0 - 1 %   Basophils Absolute 0.0  0.0 - 0.1 K/uL  COMPREHENSIVE METABOLIC PANEL      Component Value Range   Sodium 141  135 - 145 mEq/L   Potassium 3.4 (*) 3.5 - 5.1 mEq/L   Chloride 104  96 - 112 mEq/L   CO2 22  19 - 32 mEq/L   Glucose, Bld 100 (*) 70 - 99 mg/dL   BUN 62 (*) 6 - 23 mg/dL   Creatinine, Ser 6.96 (*) 0.50 - 1.35 mg/dL   Calcium 9.5  8.4 - 29.5 mg/dL   Total Protein 8.1  6.0 - 8.3 g/dL   Albumin 3.7  3.5 - 5.2 g/dL   AST 22  0 - 37 U/L   ALT 22  0 - 53 U/L   Alkaline Phosphatase 100  39 - 117 U/L   Total Bilirubin 0.3  0.3 - 1.2 mg/dL   GFR calc non Af Amer 19 (*) >90 mL/min   GFR calc Af Amer 22 (*) >90 mL/min     No results found.   No diagnosis found.    MDM  The patient was referred here for evaluation of acute renal failure.  He had routine labs done yesterday by his pcp for possible admission.  The labs were repeated today and were BUN 62/Cr 3.2.  I spoke with Dr. Kristian Covey about this and he believes the patient should be admitted for further workup.  Dr. Karilyn Cota from [facility] agrees to admit.        I personally performed the services described in this documentation, which was scribed in my presence.  The recorded information has been reviewed and considered.      Geoffery Lyons, MD 04/14/12 [phone]

## 2012-04-14 NOTE — ED Notes (Addendum)
Pt sent to er by Dr. Lodema Hong for ?fluids, pt states that he had labs done yesterday and was told to come to er today for abnormal labwork, pt complains of headache and left side abd pain at present time,

## 2012-04-15 ENCOUNTER — Inpatient Hospital Stay (HOSPITAL_COMMUNITY): Payer: Medicare Other

## 2012-04-15 DIAGNOSIS — N179 Acute kidney failure, unspecified: Principal | ICD-10-CM

## 2012-04-15 DIAGNOSIS — R011 Cardiac murmur, unspecified: Secondary | ICD-10-CM | POA: Diagnosis present

## 2012-04-15 DIAGNOSIS — E860 Dehydration: Secondary | ICD-10-CM

## 2012-04-15 DIAGNOSIS — I2510 Atherosclerotic heart disease of native coronary artery without angina pectoris: Secondary | ICD-10-CM

## 2012-04-15 LAB — COMPREHENSIVE METABOLIC PANEL
Albumin: 3 g/dL — ABNORMAL LOW (ref 3.5–5.2)
BUN: 48 mg/dL — ABNORMAL HIGH (ref 6–23)
Chloride: 110 mEq/L (ref 96–112)
Creatinine, Ser: 2.75 mg/dL — ABNORMAL HIGH (ref 0.50–1.35)
Total Bilirubin: 0.3 mg/dL (ref 0.3–1.2)
Total Protein: 6.7 g/dL (ref 6.0–8.3)

## 2012-04-15 LAB — PHOSPHORUS: Phosphorus: 3.2 mg/dL (ref 2.3–4.6)

## 2012-04-15 LAB — CBC
MCV: 89.7 fL (ref 78.0–100.0)
Platelets: 167 10*3/uL (ref 150–400)
RBC: 4.27 MIL/uL (ref 4.22–5.81)
WBC: 9.4 10*3/uL (ref 4.0–10.5)

## 2012-04-15 LAB — CREATININE, URINE, RANDOM: Creatinine, Urine: 94.7 mg/dL

## 2012-04-15 LAB — PROTEIN, URINE, RANDOM: Total Protein, Urine: 23 mg/dL

## 2012-04-15 MED ORDER — OXYCODONE-ACETAMINOPHEN 5-325 MG PO TABS
1.0000 | ORAL_TABLET | ORAL | Status: DC | PRN
  Administered 2012-04-15 – 2012-04-20 (×14): 1 via ORAL
  Filled 2012-04-15 (×15): qty 1

## 2012-04-15 MED ORDER — HYDROMORPHONE HCL PF 1 MG/ML IJ SOLN
0.5000 mg | INTRAMUSCULAR | Status: DC | PRN
  Administered 2012-04-15 – 2012-04-18 (×4): 1 mg via INTRAVENOUS
  Filled 2012-04-15 (×4): qty 1

## 2012-04-15 MED ORDER — POTASSIUM CHLORIDE CRYS ER 20 MEQ PO TBCR
40.0000 meq | EXTENDED_RELEASE_TABLET | Freq: Once | ORAL | Status: AC
  Administered 2012-04-15: 40 meq via ORAL
  Filled 2012-04-15: qty 2

## 2012-04-15 NOTE — Progress Notes (Addendum)
[facility] PROGRESS NOTE  Randy Garrett ZOX:[id number] DOB: 11-11-1940 DOA: 04/14/2012 PCP: Syliva Overman, MD  Assessment/Plan: Principal Problem:  *ARF (acute renal failure) Active Problems:  Dehydration  HYPERTENSION  Cardiac murmur  1. Acute renal failure, likely due to dehydration and ACE inhibitor.  Unfortunately, the patient's daughter brought patient's home medication and gave him a dose of lisinopril this morning.  She was instructed not to give the patient any medications and that nursing would administer all medications.  He has had some improvement in his renal function with iv fluids.  Renal consult is pending. 2. Cardiac murmur.  Likely aortic stenosis.  Check 2D echo 3. Hypertension, stable 4. Dehydration, improving 5. Hypokalemia, replete  Code Status: full code Family Communication: discussed with patient and daughter at bedside  Disposition Plan: discharge home when clinically stable  Brief narrative: This gentleman was admitted to the hospital with acute renal failure that was discovered on lab work by his primary doctor.  He did not have any specific complaints.  Consultants:  Nephrology 7/10  Procedures:    Antibiotics:    HPI/Subjective: He denies any shortness of breath or chest pain.  No dysuria, vomiting or diarrhea.  He otherwise feels well.  Objective: Filed Vitals:   04/14/12 1431 04/14/12 1840 04/15/12 0156  BP: 141/59 153/72 151/71  Pulse: 62 75 76  Temp: 97.5 F (36.4 C) 98.1 F (36.7 C) 98.4 F (36.9 C)  TempSrc:  Oral Oral  Resp: 20 20 19   Height: 5\' 9"  (1.753 m) 5\' 9"  (1.753 m)   Weight: 93.895 kg (207 lb) 93.895 kg (207 lb)   SpO2: 96% 96% 95%    Intake/Output Summary (Last 24 hours) at 04/15/12 1104 Last data filed at 04/15/12 0800  Gross per 24 hour  Intake    960 ml  Output    775 ml  Net    185 ml    Exam:   General:  Sitting up in bed, no complaints  Cardiovascular: s1, s2, rrr, 3/6 systolic  murmur in aortic area, 1+ edema b/l  Respiratory: mild wheeze b/l  Abdomen: soft, nt, bs+  Data Reviewed: Basic Metabolic Panel:  Lab 04/15/12 8119 04/14/12 1522 04/13/12 0700  NA 141 141 141  K 3.4* 3.4* 4.1  CL 110 104 102  CO2 20 22 25   GLUCOSE 99 100* 89  BUN 48* 62* 55*  CREATININE 2.75* 3.11* 3.25*  CALCIUM 8.6 9.5 9.0  MG -- -- --  PHOS -- -- --   Liver Function Tests:  Lab 04/15/12 0543 04/14/12 1522 04/13/12 0700  AST 17 22 24   ALT 17 22 17   ALKPHOS 76 100 79  BILITOT 0.3 0.3 0.6  PROT 6.7 8.1 6.8  ALBUMIN 3.0* 3.7 4.0   No results found for this basename: LIPASE:5,AMYLASE:5 in the last 168 hours No results found for this basename: AMMONIA:5 in the last 168 hours CBC:  Lab 04/15/12 0543 04/14/12 1522  WBC 9.4 12.7*  NEUTROABS -- 9.2*  HGB 13.5 15.3  HCT 38.3* 42.9  MCV 89.7 89.9  PLT 167 173   Cardiac Enzymes: No results found for this basename: CKTOTAL:5,CKMB:5,CKMBINDEX:5,TROPONINI:5 in the last 168 hours BNP (last 3 results) No results found for this basename: PROBNP:3 in the last 8760 hours CBG: No results found for this basename: GLUCAP:5 in the last 168 hours  No results found for this or any previous visit (from the past 240 hour(s)).   Studies: No results found.  Scheduled Meds:    .  sodium chloride   Intravenous Once  . amLODipine  10 mg Oral Daily  . atenolol  25 mg Oral Daily  . heparin  5,000 Units Subcutaneous Q8H  . DISCONTD: sodium chloride   Intravenous STAT  . DISCONTD: amLODipine  10 mg Oral Daily  . DISCONTD: atenolol  25 mg Oral Daily   Continuous Infusions:    . sodium chloride 125 mL/hr at 04/15/12 0509     [facility] Pager [phone]  If 7PM-7AM, please contact night-coverage [email or web address] Password [email or web address] 04/15/2012, 11:04 AM   LOS: 1 day    Renal ultrasound reviewed, requested urology consult for obstruction

## 2012-04-15 NOTE — Progress Notes (Signed)
UR Chart Review Completed  

## 2012-04-15 NOTE — Consult Note (Signed)
Randy Garrett MRN: [id number] DOB/AGE: October 07, 1941 71 y.o. Primary Care Physician:Randy Lodema Hong, MD Admit date: 04/14/2012 Chief Complaint:  Chief Complaint  Patient presents with  . Follow-up   HPI:  Pt is 71 yo male with past medical hx of HTN who was aked to come to ER because of abnormal lab results. HPI dates back to decreased po intake for 4-5 days .Patient later went for his scheduled testa nd was found to have abnormal labs and was aksed to come to ER. Pt offers no c/o chest pain NO c/o dyspnea  No c/o hematuria  no c/o fever/cough/chills  No c/o Hematuria  NO c/o syncope   Past Medical History  Diagnosis Date  . Hyperlipidemia   . Hypertension   . CAD (coronary artery disease)   . Goiter         Family History  Problem Relation Age of Onset  . Cancer Mother     breast  NO hx of ESRD/Kidney disease.   Social History:  reports that he has quit smoking. He does not have any smokeless tobacco history on file. He reports that he does not drink alcohol or use illicit drugs. NO hx of heavy metal exposure  Allergies: No Known Allergies  Medications Prior to Admission  Medication Sig Dispense Refill  . acetaminophen (TYLENOL) 325 MG tablet Take 650 mg by mouth daily as needed. pain      . amLODipine (NORVASC) 10 MG tablet Take 1 tablet (10 mg total) by mouth daily. Discontinue nifedipine  90 tablet  1  . atenolol (TENORMIN) 25 MG tablet Take 1 tablet (25 mg total) by mouth daily.  90 tablet  1  . Garlic 1000 MG CAPS Take by mouth.        Marland Kitchen gemfibrozil (LOPID) 600 MG tablet Take 1 tablet (600 mg total) by mouth 2 (two) times daily before a meal.  180 tablet  1  . lisinopril (PRINIVIL,ZESTRIL) 20 MG tablet Take 1 tablet (20 mg total) by mouth daily.  90 tablet  1  . Omega-3 Fatty Acids (FISH OIL) 1000 MG CAPS Take 2 capsules by mouth daily.        . simvastatin (ZOCOR) 20 MG tablet Take 1 tablet (20 mg total) by mouth at bedtime.  90 tablet  1  . vitamin E 400  UNIT capsule Take 800 Units by mouth daily. 2 tabs daily           WUJ:WJXBJ from the symptoms mentioned above,there are no other symptoms referable to all systems reviewed.     . sodium chloride   Intravenous Once  . amLODipine  10 mg Oral Daily  . atenolol  25 mg Oral Daily  . heparin  5,000 Units Subcutaneous Q8H  . DISCONTD: sodium chloride   Intravenous STAT  . DISCONTD: amLODipine  10 mg Oral Daily  . DISCONTD: atenolol  25 mg Oral Daily         YNW:GNFAO from the symptoms mentioned above,there are no other symptoms referable to all systems reviewed.  Physical Exam: Vital signs in last 24 hours: Temp:  [97.5 F (36.4 C)-98.4 F (36.9 C)] 98.4 F (36.9 C) (07/10 0156) Pulse Rate:  [62-76] 76  (07/10 0156) Resp:  [19-20] 19  (07/10 0156) BP: (141-153)/(59-72) 151/71 mmHg (07/10 0156) SpO2:  [95 %-96 %] 95 % (07/10 0156) Weight:  [207 lb (93.895 kg)] 207 lb (93.895 kg) (07/09 1840) Weight change:  Last BM Date: 04/14/12  Intake/Output from previous day: 07/09  0701 - 07/10 0700 In: 600 [P.O.:600] Out: 775 [Urine:775] Total I/O In: 360 [P.O.:360] Out: -    Physical Exam: General- pt is awake,alert, oriented to time place and person Resp- No acute REsp distress, CTA B/L NO Rhonchi CVS- S1S2 regular in rate and rhythm, SEM + GIT- BS+, soft, NT, ND EXT- NO LE Edema, Cyanosis CNS- CN 2-12 grossly intact. Moving all 4 extremities Psych- normal mood and affect    Lab Results: CBC  Basename 04/15/12 0543 04/14/12 1522  WBC 9.4 12.7*  HGB 13.5 15.3  HCT 38.3* 42.9  PLT 167 173    BMET  Basename 04/15/12 0543 04/14/12 1522  NA 141 141  K 3.4* 3.4*  CL 110 104  CO2 20 22  GLUCOSE 99 100*  BUN 48* 62*  CREATININE 2.75* 3.11*  CALCIUM 8.6 9.5    TRend Creat 2013  3.25==>2.75 2012  1.19--1.4 2011  1.4--2.6 2010  1.3--1.4    MICRO No results found for this or any previous visit (from the past 240 hour(s)).    Lab Results  Component  Value Date   CALCIUM 8.6 04/15/2012   2D echo on  2011 - Left ventricle: The cavity size was normal. There was mild concentric hypertrophy. Systolic function was normal. The estimated ejection fraction was in the range of 60% to 65%. Wall motion was normal; there were no regional wall motion abnormalities. - Aortic valve: Mildly calcified annulus. Trileaflet; mildly calcified leaflets. Cusp separation was mildly reduced. Valve area: 2.45cm^2(VTI). Valve area: 2.62cm^2 (Vmax). - Mitral valve: Calcified annulus. - Left atrium: The atrium was mildly to moderately dilated.  NM scan in  2011 IMPRESSION:  Abnormal Lexiscan Myoview as outlined. No diagnostic ST-segment  changes were noted. In the setting of limitations including left  arm down imaging and diaphragmatic attenuation, there is evidence  of inferior wall scar with mild, mid inferior periinfarct ischemia,  otherwise probable soft attenuation affecting the anteroseptal  region without definite scar, and LVEF of 61%. Gated imaging  suggests possible hypokinesis of the mid to basal inferior wall.   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  5)CAD- stable   6)FEN  Hypokalemic NOrmonatremic   7)Acid base Co2 at goal     Plan:  Agree with holdng ace Will w/u for CKd BMD Will  work u  to r/o other causes of CKD Will w/u for  Hepatitis Agree with replacing K Will ask for renal US to look at renal size and to r/o obstruction. Will suggest to get Daily Bmet to see creat trend.     Randy Garrett S 04/15/2012, 10:36 AM

## 2012-04-16 LAB — MPO/PR-3 (ANCA) ANTIBODIES
Myeloperoxidase Abs: 2 AU/mL (ref <20)
Serine Protease 3: <1 AU/mL (ref <20)

## 2012-04-16 LAB — SEDIMENTATION RATE: Sed Rate: 79 mm/hr — ABNORMAL HIGH (ref 0–16)

## 2012-04-16 LAB — BASIC METABOLIC PANEL
Chloride: 113 mEq/L — ABNORMAL HIGH (ref 96–112)
GFR calc Af Amer: 27 mL/min — ABNORMAL LOW (ref >90)
GFR calc non Af Amer: 24 mL/min — ABNORMAL LOW (ref >90)
Potassium: 4.6 mEq/L (ref 3.5–5.1)
Sodium: 142 mEq/L (ref 135–145)

## 2012-04-16 LAB — VITAMIN D 25 HYDROXY (VIT D DEFICIENCY, FRACTURES): Vit D, 25-Hydroxy: 49 ng/mL (ref 30–89)

## 2012-04-16 LAB — ANCA SCREEN W REFLEX TITER
Atypical p-ANCA Screen: NEGATIVE
c-ANCA Screen: NEGATIVE
p-ANCA Screen: NEGATIVE

## 2012-04-16 LAB — C4 COMPLEMENT: Complement C4, Body Fluid: 38 mg/dL (ref 10–40)

## 2012-04-16 LAB — GLOMERULAR BASEMENT MEMBRANE ANTIBODIES: GBM Ab: <1 AU/mL (ref <20)

## 2012-04-16 LAB — PTH, INTACT AND CALCIUM
Calcium, Total (PTH): 8.7 mg/dL (ref 8.4–10.5)
PTH: 47.4 pg/mL (ref 14.0–72.0)

## 2012-04-16 LAB — PHOSPHORUS: Phosphorus: 2.5 mg/dL (ref 2.3–4.6)

## 2012-04-16 LAB — ANA: Anti Nuclear Antibody(ANA): NEGATIVE

## 2012-04-16 LAB — ANTI-DNA ANTIBODY, DOUBLE-STRANDED: ds DNA Ab: 1 IU/mL (ref <30)

## 2012-04-16 LAB — C3 COMPLEMENT: C3 Complement: 158 mg/dL (ref 90–180)

## 2012-04-16 MED ORDER — ONDANSETRON HCL 4 MG/2ML IJ SOLN
4.0000 mg | Freq: Four times a day (QID) | INTRAMUSCULAR | Status: DC | PRN
  Administered 2012-04-16: 4 mg via INTRAVENOUS
  Filled 2012-04-16: qty 2

## 2012-04-16 NOTE — Progress Notes (Signed)
Subjective: Interval History: has no complaint of nausea or vomiting. Patient denies any difficulty in breathing.  Objective: Vital signs in last 24 hours: Temp:  [98 F (36.7 C)-98.9 F (37.2 C)] 98.3 F (36.8 C) (07/11 0500) Pulse Rate:  [61-76] 64  (07/11 0500) Resp:  [20] 20  (07/11 0500) BP: (131-175)/(58-77) 170/77 mmHg (07/11 0500) SpO2:  [92 %-96 %] 96 % (07/11 0500) Weight change:   Intake/Output from previous day: 07/10 0701 - 07/11 0700 In: 2248.3 [P.O.:720; I.V.:1528.3] Out: 500 [Urine:500] Intake/Output this shift:    General appearance: alert, cooperative and no distress Resp: clear to auscultation bilaterally Cardio: regular rate and rhythm, S1, S2 normal, no murmur, click, rub or gallop GI: soft, non-tender; bowel sounds normal; no masses,  no organomegaly Extremities: extremities normal, atraumatic, no cyanosis or edema  Lab Results:  Basename 04/15/12 0543 04/14/12 1522  WBC 9.4 12.7*  HGB 13.5 15.3  HCT 38.3* 42.9  PLT 167 173   BMET:  Basename 04/16/12 0618 04/15/12 0543  NA 142 141  K 4.6 3.4*  CL 113* 110  CO2 20 20  GLUCOSE 86 99  BUN 35* 48*  CREATININE 2.57* 2.75*  CALCIUM 9.0 8.6   No results found for this basename: PTH:2 in the last 72 hours Iron Studies: No results found for this basename: IRON,TIBC,TRANSFERRIN,FERRITIN in the last 72 hours  Studies/Results: US Renal  04/15/2012  *RADIOLOGY REPORT*  Clinical Data: Acute renal failure.  RENAL/URINARY TRACT ULTRASOUND COMPLETE  Comparison:  None.  Findings:  Right Kidney:  11.4 cm in length.  Renal parenchyma is slightly echogenic suggesting renal medical disease.  2.4 cm simple cyst on the lateral aspect of the right kidney.  No obstruction.  Left Kidney:  12.2 cm in length.  Slight dilatation of the infundibuli and calyces.  Renal pelvis does not appear distended. 1.7 cm echogenic lesion in the right renal pelvis may represent a stone.   4.3 cm cyst on the upper pole.  3.6 cm cyst on  the lower pole.  Bladder:  Right ureteral jet is identified.  No left ureteral jet.  IMPRESSION:  1.  Probable obstruction of the left kidney at the left renal pelvis by a 1.7 cm stone. 2.  Benign-appearing cysts bilaterally.  Original Report Authenticated By: Gwynn Burly, M.D.    I have reviewed the patient's current medications.  Assessment/Plan: Problem #1 acute kidney injury superimposed on chronic his BUN is 55 and creatinine is 2.57 renal function seems to be improving progressively. Presently patient is none oliguric. Patient ultrasound showed right kidney to be 11.4 and left kidney 12.5 cm. Problem #2 history of kidney stone patient had previous kidney stone which was passed. According to him that seems to be from the right side. Presently patient seems to have also left kidney stone. ; how ever he  does not have any significant hydronephrosis on the left side. Problem #3 history of hypertension blood pressure seems to control very well Problem #4 history of hypokalemia potassium has corrected Problem #5 history of carotid disease is a symptomatic Problem #6 history of anemia Problem #7 history of arthritis. Plan: We'll continue his hydration. We'll follow  his blood work. We'll check his basic metabolic panel and CBC in the morning. Patient possibly may require followup by urology for  his kidney stone.    LOS: 2 days   Rachael Zapanta S 04/16/2012,7:52 AM

## 2012-04-16 NOTE — Progress Notes (Signed)
[facility] PROGRESS NOTE  Randy Garrett HQI:[id number] DOB: [birth year] DOA: 04/14/2012 PCP: Syliva Overman, MD  Assessment/Plan: Principal Problem:  *ARF (acute renal failure) Active Problems:  Dehydration  HYPERTENSION  Cardiac murmur  1. Acute renal failure, likely due to dehydration and ACE inhibitor.  Appreciate input from nephrology.  He has had some improvement in his renal function with iv fluids. Patient was found to have a left renal stone.  This caused him significant pain yesterday. His family is concerned about this and request that he be seen by a urologist.  Will request Dr. Jerre Simon to see. 2. Cardiac murmur.  Likely aortic stenosis.  Check 2D echo 3. Hypertension, stable 4. Dehydration, improving 5. Hypokalemia, replete  Code Status: full code Family Communication: discussed with patient and daughter at bedside  Disposition Plan: discharge home when clinically stable  Brief narrative: This gentleman was admitted to the hospital with acute renal failure that was discovered on lab work by his primary doctor.  He did not have any specific complaints.  Consultants:  Nephrology 7/10  Urology 7/11  Procedures:    Antibiotics:    HPI/Subjective: He denies any shortness of breath or chest pain.  No dysuria, vomiting or diarrhea.  He otherwise feels well.  Objective: Filed Vitals:   04/15/12 1416 04/15/12 1644 04/15/12 2201 04/16/12 0500  BP: 175/76 149/73 131/58 170/77  Pulse: 76  61 64  Temp: 98.9 F (37.2 C)  98 F (36.7 C) 98.3 F (36.8 C)  TempSrc:   Oral Oral  Resp: 20  20 20   Height:      Weight:      SpO2: 96%  92% 96%    Intake/Output Summary (Last 24 hours) at 04/16/12 1124 Last data filed at 04/15/12 1945  Gross per 24 hour  Intake 1888.33 ml  Output    500 ml  Net 1388.33 ml    Exam:   General:  Sitting up in bed, no complaints  Cardiovascular: s1, s2, rrr, 3/6 systolic murmur in aortic area, 1+ edema  b/l  Respiratory: mild wheeze b/l  Abdomen: soft, nt, bs+  Data Reviewed: Basic Metabolic Panel:  Lab 04/16/12 1324 04/15/12 1216 04/15/12 0543 04/14/12 1522 04/13/12 0700  NA 142 -- 141 141 141  K 4.6 -- 3.4* 3.4* 4.1  CL 113* -- 110 104 102  CO2 20 -- 20 22 25   GLUCOSE 86 -- 99 100* 89  BUN 35* -- 48* 62* 55*  CREATININE 2.57* -- 2.75* 3.11* 3.25*  CALCIUM 9.0 -- 8.6 9.5 9.0  MG -- -- -- -- --  PHOS 2.5 3.2 -- -- --   Liver Function Tests:  Lab 04/15/12 0543 04/14/12 1522 04/13/12 0700  AST 17 22 24   ALT 17 22 17   ALKPHOS 76 100 79  BILITOT 0.3 0.3 0.6  PROT 6.7 8.1 6.8  ALBUMIN 3.0* 3.7 4.0   No results found for this basename: LIPASE:5,AMYLASE:5 in the last 168 hours No results found for this basename: AMMONIA:5 in the last 168 hours CBC:  Lab 04/15/12 0543 04/14/12 1522  WBC 9.4 12.7*  NEUTROABS -- 9.2*  HGB 13.5 15.3  HCT 38.3* 42.9  MCV 89.7 89.9  PLT 167 173   Cardiac Enzymes: No results found for this basename: CKTOTAL:5,CKMB:5,CKMBINDEX:5,TROPONINI:5 in the last 168 hours BNP (last 3 results) No results found for this basename: PROBNP:3 in the last 8760 hours CBG: No results found for this basename: GLUCAP:5 in the last 168 hours  No results found for  this or any previous visit (from the past 240 hour(s)).   Studies: US Renal  04/15/2012  *RADIOLOGY REPORT*  Clinical Data: Acute renal failure.  RENAL/URINARY TRACT ULTRASOUND COMPLETE  Comparison:  None.  Findings:  Right Kidney:  11.4 cm in length.  Renal parenchyma is slightly echogenic suggesting renal medical disease.  2.4 cm simple cyst on the lateral aspect of the right kidney.  No obstruction.  Left Kidney:  12.2 cm in length.  Slight dilatation of the infundibuli and calyces.  Renal pelvis does not appear distended. 1.7 cm echogenic lesion in the right renal pelvis may represent a stone.   4.3 cm cyst on the upper pole.  3.6 cm cyst on the lower pole.  Bladder:  Right ureteral jet is identified.   No left ureteral jet.  IMPRESSION:  1.  Probable obstruction of the left kidney at the left renal pelvis by a 1.7 cm stone. 2.  Benign-appearing cysts bilaterally.  Original Report Authenticated By: Gwynn Burly, M.D.    Scheduled Meds:    . amLODipine  10 mg Oral Daily  . atenolol  25 mg Oral Daily  . heparin  5,000 Units Subcutaneous Q8H  . potassium chloride  40 mEq Oral Once   Continuous Infusions:    . sodium chloride 125 mL/hr at 04/16/12 0811     [facility] Pager [phone]  If 7PM-7AM, please contact night-coverage [email or web address] Password [email or web address] 04/16/2012, 11:24 AM   LOS: 2 days

## 2012-04-16 NOTE — Progress Notes (Signed)
Pt actively vomiting after receiving 1mg  of Dilaudid IV. Dr. York Pellant made aware and Zofran 4mg  IV ordered PRN. Will administer and give patient ginger ale and saltine crackers. Will continue to monitor.

## 2012-04-16 NOTE — Progress Notes (Signed)
*  PRELIMINARY RESULTS* Echocardiogram 2D Echocardiogram has been performed.  Randy Garrett 04/16/2012, 10:58 AM

## 2012-04-16 NOTE — Progress Notes (Signed)
Order received to bladder scan pt as pt c/o urgency and pressure but was unable to start stream. Upon entering room to bladder scan pt, pt had been able to use urinal and 270 cc's of yellow urine was collected. Bladder scan completed anyway and 0 ml's were found. Dr. Kerry Hough made aware. Will continue to monitor.

## 2012-04-17 ENCOUNTER — Inpatient Hospital Stay (HOSPITAL_COMMUNITY): Payer: Medicare Other

## 2012-04-17 DIAGNOSIS — N2889 Other specified disorders of kidney and ureter: Secondary | ICD-10-CM | POA: Diagnosis present

## 2012-04-17 DIAGNOSIS — N289 Disorder of kidney and ureter, unspecified: Secondary | ICD-10-CM

## 2012-04-17 DIAGNOSIS — I10 Essential (primary) hypertension: Secondary | ICD-10-CM

## 2012-04-17 DIAGNOSIS — N200 Calculus of kidney: Secondary | ICD-10-CM | POA: Diagnosis present

## 2012-04-17 LAB — HEPATITIS PANEL, ACUTE
HCV Ab: NEGATIVE
Hep A IgM: NEGATIVE
Hep B C IgM: NEGATIVE
Hepatitis B Surface Ag: NEGATIVE

## 2012-04-17 LAB — CBC
Hemoglobin: 13.4 g/dL (ref 13.0–17.0)
MCH: 31.8 pg (ref 26.0–34.0)
MCHC: 34.6 g/dL (ref 30.0–36.0)
RDW: 12.8 % (ref 11.5–15.5)

## 2012-04-17 LAB — PROTEIN ELECTROPHORESIS, SERUM
Albumin ELP: 49.4 % — ABNORMAL LOW (ref 55.8–66.1)
Alpha-1-Globulin: 7.3 % — ABNORMAL HIGH (ref 2.9–4.9)
Alpha-2-Globulin: 15 % — ABNORMAL HIGH (ref 7.1–11.8)
Beta 2: 6.7 % — ABNORMAL HIGH (ref 3.2–6.5)
Beta Globulin: 5.2 % (ref 4.7–7.2)
Gamma Globulin: 16.4 % (ref 11.1–18.8)
M-Spike, %: NOT DETECTED g/dL
Total Protein ELP: 7.3 g/dL (ref 6.0–8.3)

## 2012-04-17 LAB — BASIC METABOLIC PANEL
BUN: 33 mg/dL — ABNORMAL HIGH (ref 6–23)
Calcium: 8.8 mg/dL (ref 8.4–10.5)
Chloride: 113 mEq/L — ABNORMAL HIGH (ref 96–112)
Creatinine, Ser: 2.38 mg/dL — ABNORMAL HIGH (ref 0.50–1.35)
GFR calc Af Amer: 30 mL/min — ABNORMAL LOW (ref >90)
GFR calc non Af Amer: 26 mL/min — ABNORMAL LOW (ref >90)

## 2012-04-17 LAB — COMPLEMENT, TOTAL: Compl, Total (CH50): >60 U/mL — ABNORMAL HIGH (ref 31–60)

## 2012-04-17 MED ORDER — SODIUM CHLORIDE 0.9 % IJ SOLN
INTRAMUSCULAR | Status: AC
  Administered 2012-04-17: 15:00:00
  Filled 2012-04-17: qty 3

## 2012-04-17 MED ORDER — SODIUM BICARBONATE 650 MG PO TABS
650.0000 mg | ORAL_TABLET | Freq: Two times a day (BID) | ORAL | Status: DC
  Administered 2012-04-17 (×2): 650 mg via ORAL
  Filled 2012-04-17 (×2): qty 1

## 2012-04-17 NOTE — Care Management Note (Signed)
    Page 1 of 1   04/20/2012     1:55:54 PM   CARE MANAGEMENT NOTE 04/20/2012  Patient:  Randy Garrett, Randy Garrett   Account Number:  [id number]  Date Initiated:  04/17/2012  Documentation initiated by:  Rosemary Holms  Subjective/Objective Assessment:   Pt admitted with acute respiratory failure and abnormal labs. Lives at home with family     Action/Plan:   CM will follow for [facility] needs   Anticipated DC Date:  04/20/2012   Anticipated DC Plan:  HOME W HOME HEALTH SERVICES      DC Planning Services  CM consult      Choice offered to / List presented to:          [facility] arranged  HH-1 RN  HH-2 PT      [facility] agency  [facility]   Status of service:  Completed, signed off Medicare Important Message given?   (If response is "NO", the following Medicare IM given date fields will be blank) Date Medicare IM given:   Date Additional Medicare IM given:    Discharge Disposition:  HOME W HOME HEALTH SERVICES  Per UR Regulation:    If discussed at Long Length of Stay Meetings, dates discussed:    Comments:  04/17/12 1400 Darl Kuss Leanord Hawking RN BSN CM

## 2012-04-17 NOTE — Progress Notes (Signed)
Subjective: Interval History: has no complaint of difficulty in breathing. Patient overall  getting better.  Objective: Vital signs in last 24 hours: Temp:  [98 F (36.7 C)-99.1 F (37.3 C)] 98.7 F (37.1 C) (07/12 0530) Pulse Rate:  [61-73] 69  (07/12 0530) Resp:  [18-20] 20  (07/12 0530) BP: (128-170)/(69-82) 128/69 mmHg (07/12 0530) SpO2:  [91 %-95 %] 91 % (07/12 0530) Weight change:   Intake/Output from previous day: 07/11 0701 - 07/12 0700 In: 1982 [P.O.:480; I.V.:1500; IV Piggyback:2] Out: 395 [Urine:395] Intake/Output this shift: Total I/O In: 120 [P.O.:120] Out: -   General appearance: alert, cooperative and no distress Resp: clear to auscultation bilaterally Cardio: regular rate and rhythm, S1, S2 normal, no murmur, click, rub or gallop GI: soft, non-tender; bowel sounds normal; no masses,  no organomegaly Extremities: extremities normal, atraumatic, no cyanosis or edema  Lab Results:  [facility] 04/17/12 0620 04/15/12 0543  WBC 9.0 9.4  HGB 13.4 13.5  HCT 38.7* 38.3*  PLT 155 167   BMET:  Basename 04/17/12 0620 04/16/12 0618  NA 139 142  K 4.1 4.6  CL 113* 113*  CO2 17* 20  GLUCOSE 92 86  BUN 33* 35*  CREATININE 2.38* 2.57*  CALCIUM 8.8 9.0    Basename 04/15/12 1216  PTH 47.4   Iron Studies: No results found for this basename: IRON,TIBC,TRANSFERRIN,FERRITIN in the last 72 hours  Studies/Results: US Renal  04/15/2012  *RADIOLOGY REPORT*  Clinical Data: Acute renal failure.  RENAL/URINARY TRACT ULTRASOUND COMPLETE  Comparison:  None.  Findings:  Right Kidney:  11.4 cm in length.  Renal parenchyma is slightly echogenic suggesting renal medical disease.  2.4 cm simple cyst on the lateral aspect of the right kidney.  No obstruction.  Left Kidney:  12.2 cm in length.  Slight dilatation of the infundibuli and calyces.  Renal pelvis does not appear distended. 1.7 cm echogenic lesion in the right renal pelvis may represent a stone.   4.3 cm cyst on the upper  pole.  3.6 cm cyst on the lower pole.  Bladder:  Right ureteral jet is identified.  No left ureteral jet.  IMPRESSION:  1.  Probable obstruction of the left kidney at the left renal pelvis by a 1.7 cm stone. 2.  Benign-appearing cysts bilaterally.  Original Report Authenticated By: Gwynn Burly, M.D.    I have reviewed the patient's current medications.  Assessment/Plan: Problem #1 acute kidney injury superimposed on chronic his BUN is 53 creatinine is 2.38 renal function seems to be improving. His urine output however doesn't seem to have changed significantly. The etiology could be multifactorial . Problem #2 low CO2 most likely secondary to metabolic acidosis Problem #3 history of possible left ureteral kidney stone. Problem #4 history of hypertension his blood pressure seems to be controlled reasonably good Problem #5 history of coronary artery disease is a symptomatic Problem #6 history of arthritis  Plan: We'll start him on sodium bicarbonate 650 mg by mouth twice a day We'll continue his hydration and the we'll add diuretics to improve his urine output. When patient is discharged out for him as an outpatient.   LOS: 3 days   Mylo Driskill S 04/17/2012,9:47 AM

## 2012-04-17 NOTE — Progress Notes (Signed)
[facility] PROGRESS NOTE  Randy Garrett:[id number] DOB: 06/29/1941 DOA: 04/14/2012 PCP: Syliva Overman, MD  Assessment/Plan: Principal Problem:  *ARF (acute renal failure) Active Problems:  Dehydration  HYPERTENSION  Cardiac murmur  Renal mass, left  Kidney stone  1. Acute renal failure, likely due to dehydration and ACE inhibitor.  Appreciate input from nephrology.  He has had  improvement in his renal function with iv fluids. Patient started on bicarb today for metabolic acidosis likely related to renal failure. He does not appear toxic/infected. 2. Renal stone/cystic renal mass.  Patient is being followed by urology.  He will undergo MRI of the abd today to better characterize the cystic mass.  It may just be a hemorrhagic cyst. He will likely need to undergo lithotripsy for the stone. 3. Cardiac murmur.  Echo confirms mild aortic stenosis. 4. Hypertension, stable 5. Dehydration, improving 6. Hypokalemia, repleted  Code Status: full code Family Communication: discussed with patient and daughter at bedside  Disposition Plan: discharge home when clinically stable  Brief narrative: This gentleman was admitted to the hospital with acute renal failure that was discovered on lab work by his primary doctor.  He did not have any specific complaints.  Consultants:  Nephrology 7/10  Urology 7/11  Procedures:    Antibiotics:    HPI/Subjective: Patient continues to have abdominal pain on the left side.  No further vomiting.  Denies any other complaints  Objective: Filed Vitals:   04/16/12 0500 04/16/12 1511 04/16/12 2132 04/17/12 0530  BP: 170/77 170/82 154/74 128/69  Pulse: 64 61 73 69  Temp: 98.3 F (36.8 C) 98 F (36.7 C) 99.1 F (37.3 C) 98.7 F (37.1 C)  TempSrc: Oral  Oral Oral  Resp: 20 20 18 20   Height:      Weight:      SpO2: 96% 95% 92% 91%    Intake/Output Summary (Last 24 hours) at 04/17/12 1220 Last data filed at 04/17/12 1148  Gross per 24 hour  Intake   1862 ml  Output    350 ml  Net   1512 ml    Exam:   General:  Sitting up in bed, no complaints  Cardiovascular: s1, s2, rrr, 3/6 systolic murmur in aortic area, 1+ edema b/l  Respiratory: mild wheeze b/l  Abdomen: soft, nt, bs+  Data Reviewed: Basic Metabolic Panel:  Lab 04/17/12 8119 04/16/12 0618 04/15/12 1216 04/15/12 0543 04/14/12 1522  NA 139 142 -- 141 141  K 4.1 4.6 -- 3.4* 3.4*  CL 113* 113* -- 110 104  CO2 17* 20 -- 20 22  GLUCOSE 92 86 -- 99 100*  BUN 33* 35* -- 48* 62*  CREATININE 2.38* 2.57* -- 2.75* 3.11*  CALCIUM 8.8 9.0 8.7 8.6 9.5  MG -- -- -- -- --  PHOS -- 2.5 3.2 -- --   Liver Function Tests:  Lab 04/15/12 0543 04/14/12 1522 04/13/12 0700  AST 17 22 24   ALT 17 22 17   ALKPHOS 76 100 79  BILITOT 0.3 0.3 0.6  PROT 6.7 8.1 6.8  ALBUMIN 3.0* 3.7 4.0   No results found for this basename: LIPASE:5,AMYLASE:5 in the last 168 hours No results found for this basename: AMMONIA:5 in the last 168 hours CBC:  Lab 04/17/12 0620 04/15/12 0543 04/14/12 1522  WBC 9.0 9.4 12.7*  NEUTROABS -- -- 9.2*  HGB 13.4 13.5 15.3  HCT 38.7* 38.3* 42.9  MCV 91.7 89.7 89.9  PLT 155 167 173   Cardiac Enzymes: No results found  for this basename: CKTOTAL:5,CKMB:5,CKMBINDEX:5,TROPONINI:5 in the last 168 hours BNP (last 3 results) No results found for this basename: PROBNP:3 in the last 8760 hours CBG: No results found for this basename: GLUCAP:5 in the last 168 hours  No results found for this or any previous visit (from the past 240 hour(s)).   Studies: Ct Abdomen Pelvis Wo Contrast  04/17/2012  *RADIOLOGY REPORT*  Clinical Data: Left flank pain.  Left hydronephrosis.  CT ABDOMEN AND PELVIS WITHOUT CONTRAST  Technique:  Multidetector CT imaging of the abdomen and pelvis was performed following the standard protocol without intravenous contrast.  Comparison: Ultrasound dated 04/15/2012  Findings: There are tiny bilateral pleural effusions.   There is an irregular 15 mm stone obstructing the left renal pelvis.  There is secondary left hydronephrosis.  The distal ureter is normal.  There is perinephric soft tissue stranding.  The patient has multiple cysts in the left kidney as demonstrated on prior ultrasound.  There is a complex left lower pole 4.3 cm cyst with irregular linear areas of high density which could represent hemorrhage. This cyst appeared simple on ultrasound but was not well seen.  There are calcifications adjacent to the cystic duct but these do not appear to be within the cystic duct and probably represent vascular calcifications.  The liver, spleen, pancreas, and adrenal glands are normal.  There are cysts on the right kidney with a 1 mm calcification in the mid right kidney.  The bowel appears normal.  No acute osseous abnormality.  Old compression fracture of the superior endplate of T12.  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  Extensive calcification of the abdominal aorta and iliac arteries.  IMPRESSION:  1.  15 mm stone obstructing the left ureteral pelvic junction. I suspect this may be secondary to chronic infection. 2.  Complex cystic lesion on the lower pole of the left kidney, probably representing a hemorrhagic cyst.  Because of the patient's impaired renal function, the best method for further assessment would be MRI of the kidneys without contrast.  Original Report Authenticated By: Gwynn Burly, M.D.   US Renal  04/15/2012  *RADIOLOGY REPORT*  Clinical Data: Acute renal failure.  RENAL/URINARY TRACT ULTRASOUND COMPLETE  Comparison:  None.  Findings:  Right Kidney:  11.4 cm in length.  Renal parenchyma is slightly echogenic suggesting renal medical disease.  2.4 cm simple cyst on the lateral aspect of the right kidney.  No obstruction.  Left Kidney:  12.2 cm in length.  Slight dilatation of the infundibuli and calyces.  Renal pelvis does not appear distended. 1.7 cm echogenic lesion in the right renal pelvis  may represent a stone.   4.3 cm cyst on the upper pole.  3.6 cm cyst on the lower pole.  Bladder:  Right ureteral jet is identified.  No left ureteral jet.  IMPRESSION:  1.  Probable obstruction of the left kidney at the left renal pelvis by a 1.7 cm stone. 2.  Benign-appearing cysts bilaterally.  Original Report Authenticated By: Gwynn Burly, M.D.    Scheduled Meds:    . amLODipine  10 mg Oral Daily  . atenolol  25 mg Oral Daily  . heparin  5,000 Units Subcutaneous Q8H  . sodium bicarbonate  650 mg Oral BID   Continuous Infusions:    . sodium chloride 125 mL/hr at 04/17/12 0837     Glorie Dowlen [facility] Pager [phone]  If 7PM-7AM, please contact night-coverage [email or web address] Password [facility] 04/17/2012, 12:20 PM  LOS: 3 days

## 2012-04-18 LAB — BASIC METABOLIC PANEL
BUN: 27 mg/dL — ABNORMAL HIGH (ref 6–23)
Calcium: 8.7 mg/dL (ref 8.4–10.5)
GFR calc Af Amer: 33 mL/min — ABNORMAL LOW (ref >90)
GFR calc non Af Amer: 28 mL/min — ABNORMAL LOW (ref >90)
Glucose, Bld: 97 mg/dL (ref 70–99)
Potassium: 3.7 mEq/L (ref 3.5–5.1)
Sodium: 138 mEq/L (ref 135–145)

## 2012-04-18 LAB — URINALYSIS, ROUTINE W REFLEX MICROSCOPIC
Glucose, UA: NEGATIVE mg/dL
Leukocytes, UA: NEGATIVE
Nitrite: NEGATIVE
Specific Gravity, Urine: 1.01 (ref 1.005–1.030)
pH: 5.5 (ref 5.0–8.0)

## 2012-04-18 LAB — URINE MICROSCOPIC-ADD ON

## 2012-04-18 MED ORDER — METOPROLOL SUCCINATE ER 50 MG PO TB24
100.0000 mg | ORAL_TABLET | Freq: Every day | ORAL | Status: DC
  Administered 2012-04-18 – 2012-04-20 (×3): 100 mg via ORAL
  Filled 2012-04-18 (×3): qty 2

## 2012-04-18 MED ORDER — PHENAZOPYRIDINE HCL 100 MG PO TABS
100.0000 mg | ORAL_TABLET | Freq: Three times a day (TID) | ORAL | Status: DC
  Administered 2012-04-18 – 2012-04-19 (×4): 100 mg via ORAL
  Filled 2012-04-18 (×5): qty 1

## 2012-04-18 MED ORDER — SODIUM BICARBONATE 650 MG PO TABS
650.0000 mg | ORAL_TABLET | Freq: Three times a day (TID) | ORAL | Status: DC
  Administered 2012-04-18 – 2012-04-19 (×6): 650 mg via ORAL
  Filled 2012-04-18 (×7): qty 1

## 2012-04-18 MED ORDER — FUROSEMIDE 10 MG/ML IJ SOLN
40.0000 mg | Freq: Two times a day (BID) | INTRAMUSCULAR | Status: DC
  Administered 2012-04-18: 40 mg via INTRAVENOUS
  Filled 2012-04-18 (×3): qty 4

## 2012-04-18 MED ORDER — METOPROLOL SUCCINATE ER 50 MG PO TB24: 50.0000 mg | ORAL_TABLET | Freq: Every day | ORAL | Status: DC

## 2012-04-18 MED ORDER — CALCIUM CARBONATE ANTACID 500 MG PO CHEW
1.0000 | CHEWABLE_TABLET | Freq: Two times a day (BID) | ORAL | Status: DC
  Administered 2012-04-18 – 2012-04-19 (×4): 200 mg via ORAL
  Filled 2012-04-18 (×5): qty 1

## 2012-04-18 NOTE — Progress Notes (Signed)
Subjective: Interval History: has no complaint of nausea or vomiting. Patient denies any difficulty breathing. Overall her history is to be her feeling better..  Objective: Vital signs in last 24 hours: Temp:  [98.3 F (36.8 C)-99.5 F (37.5 C)] 98.4 F (36.9 C) (07/13 0508) Pulse Rate:  [60-70] 70  (07/13 0508) Resp:  [20] 20  (07/13 0508) BP: (159-174)/(70-79) 174/79 mmHg (07/13 0508) SpO2:  [91 %-93 %] 93 % (07/13 0508) Weight:  [103.1 kg (227 lb 4.7 oz)] 103.1 kg (227 lb 4.7 oz) (07/13 0508) Weight change:   Intake/Output from previous day: 07/12 0701 - 07/13 0700 In: 4890.4 [P.O.:480; I.V.:4410.4] Out: 1376 [Urine:1375; Stool:1] Intake/Output this shift:    General appearance: alert, cooperative and no distress Resp: clear to auscultation bilaterally Cardio: regular rate and rhythm, S1, S2 normal, no murmur, click, rub or gallop GI: soft, non-tender; bowel sounds normal; no masses,  no organomegaly Extremities: edema 1+ edema bilaterally  Lab Results:  [facility] 04/17/12 0620  WBC 9.0  HGB 13.4  HCT 38.7*  PLT 155   BMET:  Basename 04/18/12 0610 04/17/12 0620  NA 138 139  K 3.7 4.1  CL 112 113*  CO2 16* 17*  GLUCOSE 97 92  BUN 27* 33*  CREATININE 2.22* 2.38*  CALCIUM 8.7 8.8    Basename 04/15/12 1216  PTH 47.4   Iron Studies: No results found for this basename: IRON,TIBC,TRANSFERRIN,FERRITIN in the last 72 hours  Studies/Results: Ct Abdomen Pelvis Wo Contrast  04/17/2012  *RADIOLOGY REPORT*  Clinical Data: Left flank pain.  Left hydronephrosis.  CT ABDOMEN AND PELVIS WITHOUT CONTRAST  Technique:  Multidetector CT imaging of the abdomen and pelvis was performed following the standard protocol without intravenous contrast.  Comparison: Ultrasound dated 04/15/2012  Findings: There are tiny bilateral pleural effusions.  There is an irregular 15 mm stone obstructing the left renal pelvis.  There is secondary left hydronephrosis.  The distal ureter is normal.   There is perinephric soft tissue stranding.  The patient has multiple cysts in the left kidney as demonstrated on prior ultrasound.  There is a complex left lower pole 4.3 cm cyst with irregular linear areas of high density which could represent hemorrhage. This cyst appeared simple on ultrasound but was not well seen.  There are calcifications adjacent to the cystic duct but these do not appear to be within the cystic duct and probably represent vascular calcifications.  The liver, spleen, pancreas, and adrenal glands are normal.  There are cysts on the right kidney with a 1 mm calcification in the mid right kidney.  The bowel appears normal.  No acute osseous abnormality.  Old compression fracture of the superior endplate of T12.  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  Extensive calcification of the abdominal aorta and iliac arteries.  IMPRESSION:  1.  15 mm stone obstructing the left ureteral pelvic junction. I suspect this may be secondary to chronic infection. 2.  Complex cystic lesion on the lower pole of the left kidney, probably representing a hemorrhagic cyst.  Because of the patient's impaired renal function, the best method for further assessment would be MRI of the kidneys without contrast.  Original Report Authenticated By: Gwynn Burly, M.D.   Mr Abdomen Wo Contrast  04/17/2012  *RADIOLOGY REPORT*  Clinical Data: Complex cystic mass in the left kidney.  MRI ABDOMEN WITHOUT CONTRAST  Technique:  Multiplanar multisequence MR imaging of the abdomen was performed. No intravenous contrast was administered.  Comparison: 04/17/2012  Findings: The patient was unable to suspend respirations, resulting in motion artifact.  IV contrast cannot be employed due to renal insufficiency (GFR 26 ml/minute).  The breathing motion causes ghosting artifact and significant reduction in signal.  Bilateral cystic lesions are as shown on recent CT scan.  The complex cystic lesion of the left kidney lower pole has  primarily high T2 and low T1 signal characteristics similar to the other cyst in this vicinity, but demonstrates a definite complexity and internal low T2 signal on images 23-18 of series 2.  Obvious internal flow was not shown in this lesion on the ultrasound from 04/15/2012, although gain settings are uncertain in this does not to definitively in dictate benign complex lesion.  Multiple additional cysts bilaterally have high T2 and low T1 signal characteristics.  Somewhat heterogeneous signal intensity along the left kidney lower pole renal hilum is observed, potentially representing complex parapelvic cysts, or filling defect in the lower pole collecting system which might include blood products, fungus ball, for tumor.  A left UPJ calculus is shown on image 21 of series 2.  There is thought to be a single septation in the 5.5 cm left kidney upper pole dominant cyst.  Indistinct tissue along the left upper pole renal hilum is noted, probably representing edematous adipose tissue due to the perirenal stranding.  This is shown on images 23- 24 of series 2  Visualized right renal cyst appears simple on noncontrast MRI.  The gallbladder and biliary system appear unremarkable.  No additional significant abnormality observed.  IMPRESSION:  1.  Complex cystic lesion exophytic from the left kidney lower pole could represent a hemorrhagic cyst or cystic renal cell carcinoma. Without IV contrast, differentiation by imaging is problematic and may require close follow-up imaging. 2.  Indistinct heterogeneous signal in the left kidney lower pole collecting system region could represent complex parapelvic cysts, blood products in the collecting system, fungus ball, or conceivably tumor. Ureterogram may be warranted following ordering treatment of the left UPJ calculus in order to exclude urothelial tumor.  3.  Obstructive left UPJ calculus. 4.  Septated but otherwise benign cyst of the left kidney upper pole. 5.  Multiple  additional cysts are present bilaterally in the kidneys. 6.  Significantly reduced diagnostic sensitivity and specificity primarily secondary to breathing motion.  Original Report Authenticated By: Dellia Cloud, M.D.    I have reviewed the patient's current medications.  Assessment/Plan: Problem #1 renal failure his BUN and creatinine this moment seems to be improving. Patient presently none oliguric. The etiology for his renal failure seems to be multifactorial. Problem #2 history of kidney stone patient being followed by urology. Problem #3 bilateral cystic kidney disease one of them the left kidney lower pole defined as complex. Since patient has multiple renal cyst or sure as this moment whether we are dealing with polycystic kidney disease. At this moment there is no family history of ADPKD. Problem #4 hypertension his blood pressure seems to be somewhat high Problem #5 history of coronary artery disease Problem #6 history of her arthritis. Problem #7 low CO2 most likely metabolic he started on sodium bicarbonate CO2 was this moment is still low. Plan we'll increase his sodium bicarbonate to 650 mg by mouth 3 times a day We'll change Tenormin to Toprol-XL 50 mg once a day. We'll check his basic metabolic panel in the morning.   LOS: 4 days   Khiree Bukhari S 04/18/2012,8:21 AM

## 2012-04-18 NOTE — Progress Notes (Addendum)
Mr Fukuda would like referred on Monday to Dr Darvin Neighbours Urologist at [facility] [phone]

## 2012-04-18 NOTE — Progress Notes (Signed)
Subjective: This man feels better. He has no nausea or vomiting. He had an MRI of his kidneys done which appears to show, on gross inspection, a polycystic kidney type picture rather than specific mass. His renal function is improving. His blood pressure remains elevated.           Physical Exam: Blood pressure 174/79, pulse 70, temperature 98.4 F (36.9 C), temperature source Oral, resp. rate 20, height 5\' 9"  (1.753 m), weight 103.1 kg (227 lb 4.7 oz), SpO2 93.00%. He looks systemically well. Heart sounds are present with a soft systolic murmur which is due to mild aortic stenosis. Lung fields are clear. He is alert and orientated.   Investigations:     Basic Metabolic Panel:  Basename 04/18/12 0610 04/17/12 0620 04/16/12 0618 04/15/12 1216  NA 138 139 -- --  K 3.7 4.1 -- --  CL 112 113* -- --  CO2 16* 17* -- --  GLUCOSE 97 92 -- --  BUN 27* 33* -- --  CREATININE 2.22* 2.38* -- --  CALCIUM 8.7 8.8 -- --  MG -- -- -- --  PHOS -- -- 2.5 3.2       CBC:  Basename 04/17/12 0620  WBC 9.0  NEUTROABS --  HGB 13.4  HCT 38.7*  MCV 91.7  PLT 155    Ct Abdomen Pelvis Wo Contrast  04/17/2012  *RADIOLOGY REPORT*  Clinical Data: Left flank pain.  Left hydronephrosis.  CT ABDOMEN AND PELVIS WITHOUT CONTRAST  Technique:  Multidetector CT imaging of the abdomen and pelvis was performed following the standard protocol without intravenous contrast.  Comparison: Ultrasound dated 04/15/2012  Findings: There are tiny bilateral pleural effusions.  There is an irregular 15 mm stone obstructing the left renal pelvis.  There is secondary left hydronephrosis.  The distal ureter is normal.  There is perinephric soft tissue stranding.  The patient has multiple cysts in the left kidney as demonstrated on prior ultrasound.  There is a complex left lower pole 4.3 cm cyst with irregular linear areas of high density which could represent hemorrhage. This cyst appeared simple on ultrasound but was  not well seen.  There are calcifications adjacent to the cystic duct but these do not appear to be within the cystic duct and probably represent vascular calcifications.  The liver, spleen, pancreas, and adrenal glands are normal.  There are cysts on the right kidney with a 1 mm calcification in the mid right kidney.  The bowel appears normal.  No acute osseous abnormality.  Old compression fracture of the superior endplate of T12.  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  Extensive calcification of the abdominal aorta and iliac arteries.  IMPRESSION:  1.  15 mm stone obstructing the left ureteral pelvic junction. I suspect this may be secondary to chronic infection. 2.  Complex cystic lesion on the lower pole of the left kidney, probably representing a hemorrhagic cyst.  Because of the patient's impaired renal function, the best method for further assessment would be MRI of the kidneys without contrast.  Original Report Authenticated By: Gwynn Burly, M.D.   Mr Abdomen Wo Contrast  04/17/2012  *RADIOLOGY REPORT*  Clinical Data: Complex cystic mass in the left kidney.  MRI ABDOMEN WITHOUT CONTRAST  Technique:  Multiplanar multisequence MR imaging of the abdomen was performed. No intravenous contrast was administered.  Comparison: 04/17/2012  Findings: The patient was unable to suspend respirations, resulting in motion artifact.  IV contrast cannot be employed due to renal  insufficiency (GFR 26 ml/minute).  The breathing motion causes ghosting artifact and significant reduction in signal.  Bilateral cystic lesions are as shown on recent CT scan.  The complex cystic lesion of the left kidney lower pole has primarily high T2 and low T1 signal characteristics similar to the other cyst in this vicinity, but demonstrates a definite complexity and internal low T2 signal on images 23-18 of series 2.  Obvious internal flow was not shown in this lesion on the ultrasound from 04/15/2012, although gain settings are  uncertain in this does not to definitively in dictate benign complex lesion.  Multiple additional cysts bilaterally have high T2 and low T1 signal characteristics.  Somewhat heterogeneous signal intensity along the left kidney lower pole renal hilum is observed, potentially representing complex parapelvic cysts, or filling defect in the lower pole collecting system which might include blood products, fungus ball, for tumor.  A left UPJ calculus is shown on image 21 of series 2.  There is thought to be a single septation in the 5.5 cm left kidney upper pole dominant cyst.  Indistinct tissue along the left upper pole renal hilum is noted, probably representing edematous adipose tissue due to the perirenal stranding.  This is shown on images 23- 24 of series 2  Visualized right renal cyst appears simple on noncontrast MRI.  The gallbladder and biliary system appear unremarkable.  No additional significant abnormality observed.  IMPRESSION:  1.  Complex cystic lesion exophytic from the left kidney lower pole could represent a hemorrhagic cyst or cystic renal cell carcinoma. Without IV contrast, differentiation by imaging is problematic and may require close follow-up imaging. 2.  Indistinct heterogeneous signal in the left kidney lower pole collecting system region could represent complex parapelvic cysts, blood products in the collecting system, fungus ball, or conceivably tumor. Ureterogram may be warranted following ordering treatment of the left UPJ calculus in order to exclude urothelial tumor.  3.  Obstructive left UPJ calculus. 4.  Septated but otherwise benign cyst of the left kidney upper pole. 5.  Multiple additional cysts are present bilaterally in the kidneys. 6.  Significantly reduced diagnostic sensitivity and specificity primarily secondary to breathing motion.  Original Report Authenticated By: Dellia Cloud, M.D.      Medications:  Scheduled:   . amLODipine  10 mg Oral Daily  .  furosemide  40 mg Intravenous BID  . heparin  5,000 Units Subcutaneous Q8H  . metoprolol succinate  100 mg Oral Daily  . sodium bicarbonate  650 mg Oral TID  . sodium chloride      . DISCONTD: atenolol  25 mg Oral Daily  . DISCONTD: metoprolol succinate  50 mg Oral Daily  . DISCONTD: sodium bicarbonate  650 mg Oral BID    Impression: 1. Acute renal failure, improving, multifactorial. Hypertension, possible polycystic kidney disease are contributing. 2. Obstructive left ureter pelvic junction calculus. There is no significant left hydronephrosis however. 3. Hypertension. 4. Mild aortic stenosis.     Plan: 1. Continue with current therapy. 2. Further recommendations from Dr. Jerre Simon, urology who has seen the patient.     LOS: 4 days   Wilson Singer Pager [phone]  04/18/2012, 8:59 AM

## 2012-04-18 NOTE — Progress Notes (Signed)
[id number] 

## 2012-04-19 LAB — COMPREHENSIVE METABOLIC PANEL
ALT: 51 U/L (ref 0–53)
AST: 49 U/L — ABNORMAL HIGH (ref 0–37)
Albumin: 2.7 g/dL — ABNORMAL LOW (ref 3.5–5.2)
Alkaline Phosphatase: 79 U/L (ref 39–117)
Chloride: 108 mEq/L (ref 96–112)
Creatinine, Ser: 2.33 mg/dL — ABNORMAL HIGH (ref 0.50–1.35)
Potassium: 3.2 mEq/L — ABNORMAL LOW (ref 3.5–5.1)
Sodium: 139 mEq/L (ref 135–145)
Total Bilirubin: 0.2 mg/dL — ABNORMAL LOW (ref 0.3–1.2)

## 2012-04-19 LAB — CBC
Platelets: 179 10*3/uL (ref 150–400)
RBC: 4.08 MIL/uL — ABNORMAL LOW (ref 4.22–5.81)
RDW: 12.2 % (ref 11.5–15.5)
WBC: 8.8 10*3/uL (ref 4.0–10.5)

## 2012-04-19 LAB — URINE CULTURE: Special Requests: NORMAL

## 2012-04-19 MED ORDER — CLONIDINE HCL 0.2 MG PO TABS
0.2000 mg | ORAL_TABLET | Freq: Two times a day (BID) | ORAL | Status: DC
  Administered 2012-04-19 (×2): 0.2 mg via ORAL
  Filled 2012-04-19 (×2): qty 1

## 2012-04-19 MED ORDER — SENNOSIDES-DOCUSATE SODIUM 8.6-50 MG PO TABS
1.0000 | ORAL_TABLET | Freq: Two times a day (BID) | ORAL | Status: DC | PRN
  Administered 2012-04-19 – 2012-04-20 (×2): 1 via ORAL
  Filled 2012-04-19 (×2): qty 1

## 2012-04-19 MED ORDER — POTASSIUM CHLORIDE IN NACL 40-0.9 MEQ/L-% IV SOLN
INTRAVENOUS | Status: DC
  Administered 2012-04-19 (×2): via INTRAVENOUS
  Filled 2012-04-19 (×3): qty 1000

## 2012-04-19 NOTE — Progress Notes (Signed)
Subjective: This man feels better. Yesterday he did have some burning on micturition. It turns out that he asked the past a couple of small stones. I've spoken with Dr. Ihor Gully, urologist, and he recommended that radiology guided cyst aspiration was appropriate to look for cytology. This can be done as an outpatient. His blood pressure has remained somewhat elevated.          Physical Exam: Blood pressure 184/92, pulse 72, temperature 97.9 F (36.6 C), temperature source Oral, resp. rate 24, height 5\' 9"  (1.753 m), weight 103.1 kg (227 lb 4.7 oz), SpO2 97.00%. He looks systemically well. Heart sounds are present with a soft systolic murmur which is due to mild aortic stenosis. Lung fields are clear. He is alert and orientated.   Investigations:     Basic Metabolic Panel:  Basename 04/19/12 0645 04/18/12 0610  NA 139 138  K 3.2* 3.7  CL 108 112  CO2 21 16*  GLUCOSE 100* 97  BUN 25* 27*  CREATININE 2.33* 2.22*  CALCIUM 8.9 8.7  MG -- --  PHOS 2.5 --       CBC:  Basename 04/19/12 0645 04/17/12 0620  WBC 8.8 9.0  NEUTROABS -- --  HGB 12.8* 13.4  HCT 36.4* 38.7*  MCV 89.2 91.7  PLT 179 155    Mr Abdomen Wo Contrast  04/17/2012  *RADIOLOGY REPORT*  Clinical Data: Complex cystic mass in the left kidney.  MRI ABDOMEN WITHOUT CONTRAST  Technique:  Multiplanar multisequence MR imaging of the abdomen was performed. No intravenous contrast was administered.  Comparison: 04/17/2012  Findings: The patient was unable to suspend respirations, resulting in motion artifact.  IV contrast cannot be employed due to renal insufficiency (GFR 26 ml/minute).  The breathing motion causes ghosting artifact and significant reduction in signal.  Bilateral cystic lesions are as shown on recent CT scan.  The complex cystic lesion of the left kidney lower pole has primarily high T2 and low T1 signal characteristics similar to the other cyst in this vicinity, but demonstrates a definite  complexity and internal low T2 signal on images 23-18 of series 2.  Obvious internal flow was not shown in this lesion on the ultrasound from 04/15/2012, although gain settings are uncertain in this does not to definitively in dictate benign complex lesion.  Multiple additional cysts bilaterally have high T2 and low T1 signal characteristics.  Somewhat heterogeneous signal intensity along the left kidney lower pole renal hilum is observed, potentially representing complex parapelvic cysts, or filling defect in the lower pole collecting system which might include blood products, fungus ball, for tumor.  A left UPJ calculus is shown on image 21 of series 2.  There is thought to be a single septation in the 5.5 cm left kidney upper pole dominant cyst.  Indistinct tissue along the left upper pole renal hilum is noted, probably representing edematous adipose tissue due to the perirenal stranding.  This is shown on images 23- 24 of series 2  Visualized right renal cyst appears simple on noncontrast MRI.  The gallbladder and biliary system appear unremarkable.  No additional significant abnormality observed.  IMPRESSION:  1.  Complex cystic lesion exophytic from the left kidney lower pole could represent a hemorrhagic cyst or cystic renal cell carcinoma. Without IV contrast, differentiation by imaging is problematic and may require close follow-up imaging. 2.  Indistinct heterogeneous signal in the left kidney lower pole collecting system region could represent complex parapelvic cysts, blood products in the collecting system,  fungus ball, or conceivably tumor. Ureterogram may be warranted following ordering treatment of the left UPJ calculus in order to exclude urothelial tumor.  3.  Obstructive left UPJ calculus. 4.  Septated but otherwise benign cyst of the left kidney upper pole. 5.  Multiple additional cysts are present bilaterally in the kidneys. 6.  Significantly reduced diagnostic sensitivity and specificity  primarily secondary to breathing motion.  Original Report Authenticated By: Dellia Cloud, M.D.      Medications:  Scheduled:    . amLODipine  10 mg Oral Daily  . calcium carbonate  1 tablet Oral BID  . cloNIDine  0.2 mg Oral BID  . heparin  5,000 Units Subcutaneous Q8H  . metoprolol succinate  100 mg Oral Daily  . phenazopyridine  100 mg Oral TID WC  . sodium bicarbonate  650 mg Oral TID  . DISCONTD: furosemide  40 mg Intravenous BID    Impression: 1. Acute renal failure, slightly worse today. Likely secondary to hypertension. 2. Obstructive left ureter pelvic junction calculus. There is no significant left hydronephrosis however. 3. Hypertension, uncontrolled. 4. Mild aortic stenosis.     Plan: 1. Discontinue IV Lasix. 2. Continue with slightly reduced rate of IV fluids. 3. Clonidine has been added to his antihypertensive medications, I agree with this. 4. Consider discharge home tomorrow with followup with urology at [place] as an outpatient.     LOS: 5 days   Wilson Singer Pager [phone]  04/19/2012, 9:32 AM

## 2012-04-19 NOTE — Progress Notes (Signed)
NAME:  RENARD, CAPERTON NO.:  [id number]  MEDICAL RECORD NO.:  [id number]  LOCATION:  A312                          FACILITY:  [facility]  PHYSICIAN:  Ky Barban, M.D.DATE OF BIRTH:  Jan 24, 1941  DATE OF PROCEDURE: DATE OF DISCHARGE:                                PROGRESS NOTE   His renal functions are improving, but MRI of the abdomen, which was focused to the left kidney, there was a questionable cystic mass with soft tissue in it.  The radiologist cannot be 100% sure if there is a tumor in this cyst.  He says it is difficult to elaborate because of the patient's creatinine, he cannot get contrast.  So, he cannot be 100% sure what this thing is.  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  The other way to is to follow up this patient, do another MRI in 3-4 months and he has a big stone in that kidney, which is causing partial obstruction.  I think the best thing will be for me to refer him to tertiary care center, maybe I can get another opinion from [facility] and I have told the family I will see them in the office, make arrangement to see another specialist over there.  Get his opinion about this thing before even I think about treating this stone if it is a tumor, it is either one to treat it.     Ky Barban, M.D.     MIJ/MEDQ  D:  04/18/2012  T:  04/19/2012  Job:  [id number]

## 2012-04-19 NOTE — Progress Notes (Signed)
Subjective: Interval History: has no complaint of vomiting. Presently  patient claims his feeling better..  Objective: Vital signs in last 24 hours: Temp:  [97.9 F (36.6 C)-98.7 F (37.1 C)] 97.9 F (36.6 C) (07/14 0454) Pulse Rate:  [57-72] 72  (07/14 0454) Resp:  [18-24] 24  (07/14 0454) BP: (176-184)/(73-92) 184/92 mmHg (07/14 0454) SpO2:  [95 %-97 %] 97 % (07/14 0454) Weight change:   Intake/Output from previous day: 07/13 0701 - 07/14 0700 In: 3121.7 [P.O.:600; I.V.:2521.7] Out: 3450 [Urine:3450] Intake/Output this shift:    General appearance: alert, cooperative and no distress Resp: clear to auscultation bilaterally Cardio: regular rate and rhythm, S1, S2 normal, no murmur, click, rub or gallop GI: soft, non-tender; bowel sounds normal; no masses,  no organomegaly Extremities: edema 1+ edema  Lab Results:  [facility] 04/19/12 0645 04/17/12 0620  WBC 8.8 9.0  HGB 12.8* 13.4  HCT 36.4* 38.7*  PLT 179 155   BMET:  Basename 04/19/12 0645 04/18/12 0610  NA 139 138  K 3.2* 3.7  CL 108 112  CO2 21 16*  GLUCOSE 100* 97  BUN 25* 27*  CREATININE 2.33* 2.22*  CALCIUM 8.9 8.7   No results found for this basename: PTH:2 in the last 72 hours Iron Studies: No results found for this basename: IRON,TIBC,TRANSFERRIN,FERRITIN in the last 72 hours  Studies/Results: Ct Abdomen Pelvis Wo Contrast  04/17/2012  *RADIOLOGY REPORT*  Clinical Data: Left flank pain.  Left hydronephrosis.  CT ABDOMEN AND PELVIS WITHOUT CONTRAST  Technique:  Multidetector CT imaging of the abdomen and pelvis was performed following the standard protocol without intravenous contrast.  Comparison: Ultrasound dated 04/15/2012  Findings: There are tiny bilateral pleural effusions.  There is an irregular 15 mm stone obstructing the left renal pelvis.  There is secondary left hydronephrosis.  The distal ureter is normal.  There is perinephric soft tissue stranding.  The patient has multiple cysts in the left  kidney as demonstrated on prior ultrasound.  There is a complex left lower pole 4.3 cm cyst with irregular linear areas of high density which could represent hemorrhage. This cyst appeared simple on ultrasound but was not well seen.  There are calcifications adjacent to the cystic duct but these do not appear to be within the cystic duct and probably represent vascular calcifications.  The liver, spleen, pancreas, and adrenal glands are normal.  There are cysts on the right kidney with a 1 mm calcification in the mid right kidney.  The bowel appears normal.  No acute osseous abnormality.  Old compression fracture of the superior endplate of T12.  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  Extensive calcification of the abdominal aorta and iliac arteries.  IMPRESSION:  1.  15 mm stone obstructing the left ureteral pelvic junction. I suspect this may be secondary to chronic infection. 2.  Complex cystic lesion on the lower pole of the left kidney, probably representing a hemorrhagic cyst.  Because of the patient's impaired renal function, the best method for further assessment would be MRI of the kidneys without contrast.  Original Report Authenticated By: Gwynn Burly, M.D.   Mr Abdomen Wo Contrast  04/17/2012  *RADIOLOGY REPORT*  Clinical Data: Complex cystic mass in the left kidney.  MRI ABDOMEN WITHOUT CONTRAST  Technique:  Multiplanar multisequence MR imaging of the abdomen was performed. No intravenous contrast was administered.  Comparison: 04/17/2012  Findings: The patient was unable to suspend respirations, resulting in motion artifact.  IV contrast cannot be employed  due to renal insufficiency (GFR 26 ml/minute).  The breathing motion causes ghosting artifact and significant reduction in signal.  Bilateral cystic lesions are as shown on recent CT scan.  The complex cystic lesion of the left kidney lower pole has primarily high T2 and low T1 signal characteristics similar to the other cyst in this  vicinity, but demonstrates a definite complexity and internal low T2 signal on images 23-18 of series 2.  Obvious internal flow was not shown in this lesion on the ultrasound from 04/15/2012, although gain settings are uncertain in this does not to definitively in dictate benign complex lesion.  Multiple additional cysts bilaterally have high T2 and low T1 signal characteristics.  Somewhat heterogeneous signal intensity along the left kidney lower pole renal hilum is observed, potentially representing complex parapelvic cysts, or filling defect in the lower pole collecting system which might include blood products, fungus ball, for tumor.  A left UPJ calculus is shown on image 21 of series 2.  There is thought to be a single septation in the 5.5 cm left kidney upper pole dominant cyst.  Indistinct tissue along the left upper pole renal hilum is noted, probably representing edematous adipose tissue due to the perirenal stranding.  This is shown on images 23- 24 of series 2  Visualized right renal cyst appears simple on noncontrast MRI.  The gallbladder and biliary system appear unremarkable.  No additional significant abnormality observed.  IMPRESSION:  1.  Complex cystic lesion exophytic from the left kidney lower pole could represent a hemorrhagic cyst or cystic renal cell carcinoma. Without IV contrast, differentiation by imaging is problematic and may require close follow-up imaging. 2.  Indistinct heterogeneous signal in the left kidney lower pole collecting system region could represent complex parapelvic cysts, blood products in the collecting system, fungus ball, or conceivably tumor. Ureterogram may be warranted following ordering treatment of the left UPJ calculus in order to exclude urothelial tumor.  3.  Obstructive left UPJ calculus. 4.  Septated but otherwise benign cyst of the left kidney upper pole. 5.  Multiple additional cysts are present bilaterally in the kidneys. 6.  Significantly reduced  diagnostic sensitivity and specificity primarily secondary to breathing motion.  Original Report Authenticated By: Dellia Cloud, M.D.    I have reviewed the patient's current medications.  Assessment/Plan: Problem #1 renal failure at this moment seems to be a chronic most likely stage III. Patient doesn't have any uremic sign and symptoms. Problem #2 hypertension his blood pressure still is remains high Problem #3 history of her left kidney stone patient has this moment none oliguric. Problem #4 history of coronary artery disease patient is a symptomatic Problem #5 history of hypokalemia patient on potassium supplement. Problem #6 history of metabolic acidosis CO2 has improved. Problem #7 history of multiple cysts patient no history of polycystic kidney disease. He seems to have one complex cyst mainly on the left. Plan: We'll start him on Catapres 0.2 mg by mouth twice a day We'll continue his other medication as before We'll DC his Lasix. We'll check his basic metabolic panel in the morning.    LOS: 5 days   Masami Plata S 04/19/2012,8:19 AM

## 2012-04-20 LAB — UIFE/LIGHT CHAINS/TP QN, 24-HR UR
Albumin, U: DETECTED
Alpha 1, Urine: DETECTED — AB
Alpha 2, Urine: DETECTED — AB
Beta, Urine: DETECTED — AB
Free Kappa Lt Chains,Ur: 20.1 mg/dL — ABNORMAL HIGH (ref 0.14–2.42)
Free Kappa/Lambda Ratio: 7.36 ratio (ref 2.04–10.37)
Free Lambda Excretion/Day: 2.73 mg/d
Free Lambda Lt Chains,Ur: 2.73 mg/dL — ABNORMAL HIGH (ref 0.02–0.67)
Free Lt Chn Excr Rate: 20.1 mg/d
Gamma Globulin, Urine: DETECTED — AB
Time: 24 hours
Total Protein, Urine-Ur/day: 31 mg/d (ref 10–140)
Total Protein, Urine: 30.9 mg/dL
Volume, Urine: 100 mL

## 2012-04-20 LAB — BASIC METABOLIC PANEL
BUN: 24 mg/dL — ABNORMAL HIGH (ref 6–23)
CO2: 20 mEq/L (ref 19–32)
Calcium: 9.2 mg/dL (ref 8.4–10.5)
Glucose, Bld: 97 mg/dL (ref 70–99)
Sodium: 142 mEq/L (ref 135–145)

## 2012-04-20 MED ORDER — SENNOSIDES-DOCUSATE SODIUM 8.6-50 MG PO TABS
1.0000 | ORAL_TABLET | Freq: Once | ORAL | Status: AC
  Administered 2012-04-20: 1 via ORAL
  Filled 2012-04-20: qty 1

## 2012-04-20 MED ORDER — AMLODIPINE BESYLATE 10 MG PO TABS: 10.0000 mg | ORAL_TABLET | Freq: Every day | ORAL | Status: DC

## 2012-04-20 MED ORDER — CLONIDINE HCL 0.3 MG PO TABS: 0.3000 mg | ORAL_TABLET | Freq: Two times a day (BID) | ORAL | Status: DC

## 2012-04-20 MED ORDER — PHENAZOPYRIDINE HCL 100 MG PO TABS: 100.0000 mg | ORAL_TABLET | Freq: Three times a day (TID) | ORAL | Status: DC

## 2012-04-20 MED ORDER — PHENAZOPYRIDINE HCL 100 MG PO TABS: 100.0000 mg | ORAL_TABLET | Freq: Three times a day (TID) | ORAL | Status: AC

## 2012-04-20 MED ORDER — SENNOSIDES-DOCUSATE SODIUM 8.6-50 MG PO TABS: 1.0000 | ORAL_TABLET | Freq: Two times a day (BID) | ORAL | Status: AC | PRN

## 2012-04-20 MED ORDER — METOPROLOL SUCCINATE ER 100 MG PO TB24: 100.0000 mg | ORAL_TABLET | Freq: Every day | ORAL | Status: DC

## 2012-04-20 MED ORDER — GLYCERIN (LAXATIVE) 2 G RE SUPP
1.0000 | Freq: Once | RECTAL | Status: DC
  Filled 2012-04-20: qty 1

## 2012-04-20 MED ORDER — CLONIDINE HCL 0.2 MG PO TABS
0.3000 mg | ORAL_TABLET | Freq: Two times a day (BID) | ORAL | Status: DC
  Administered 2012-04-20: 0.3 mg via ORAL
  Filled 2012-04-20: qty 1

## 2012-04-20 MED ORDER — OXYCODONE-ACETAMINOPHEN 5-325 MG PO TABS: 1.0000 | ORAL_TABLET | ORAL | Status: AC | PRN

## 2012-04-20 NOTE — Progress Notes (Signed)
Pt with orders to be discharged with home health. Discharge instructions given to patient and daughter, verbalized understanding. Daughter gave teach back. Appointment made for Dr. Darvin Neighbours at 4pm today, daughter is aware. Dr. Karilyn Cota ordered enema before patient discharge, given as ordered with effective results. Pt in stable condition upon discharge.

## 2012-04-20 NOTE — Progress Notes (Signed)
Subjective: Interval History: has no complaint of nausea or vomiting. His appetite is good. At this moment solid complaint patient has is constipation. Patient doesn't have any difficulty in breathing..  Objective: Vital signs in last 24 hours: Temp:  [98.4 F (36.9 C)-99.8 F (37.7 C)] 99.8 F (37.7 C) (07/15 0510) Pulse Rate:  [53-72] 63  (07/15 0510) Resp:  [22] 22  (07/15 0510) BP: (139-171)/(68-85) 162/85 mmHg (07/15 0510) SpO2:  [93 %-95 %] 93 % (07/15 0510) Weight change:   Intake/Output from previous day: 07/14 0701 - 07/15 0700 In: 2317.5 [P.O.:720; I.V.:1597.5] Out: 1175 [Urine:1175] Intake/Output this shift:    General appearance: alert, cooperative and no distress Resp: clear to auscultation bilaterally Cardio: regular rate and rhythm, S1, S2 normal, no murmur, click, rub or gallop GI: soft, non-tender; bowel sounds normal; no masses,  no organomegaly Extremities: extremities normal, atraumatic, no cyanosis or edema  Lab Results:  [facility] 04/19/12 0645  WBC 8.8  HGB 12.8*  HCT 36.4*  PLT 179   BMET:  Basename 04/20/12 0520 04/19/12 0645  NA 142 139  K 3.9 3.2*  CL 112 108  CO2 20 21  GLUCOSE 97 100*  BUN 24* 25*  CREATININE 1.86* 2.33*  CALCIUM 9.2 8.9   No results found for this basename: PTH:2 in the last 72 hours Iron Studies: No results found for this basename: IRON,TIBC,TRANSFERRIN,FERRITIN in the last 72 hours  Studies/Results: No results found.  I have reviewed the patient's current medications.  Assessment/Plan: Problem #1 her renal failure acute on chronic his pedis and 4 creatinine is 1.86 renal function seems to be improving. Problem #2 hypokalemia potassium is 3.9 has improved Problem #3 history of hypertension blood pressure still high but seems to be improving Problem #5 history of kidney stone Problem #5 history of multiple cysts of the kidney. Problem #6 history of coronary artery disease patient is a symptomatic Problem #7  history of hyperlipidemia. Plan: We'll DC potassium The patient is going to be discharged we'll follow him as an outpatient. We'll increase clonidine to 0.3 mg by mouth twice a day.    LOS: 6 days   Erica Osuna S 04/20/2012,7:17 AM

## 2012-04-20 NOTE — Discharge Summary (Signed)
Physician Discharge Summary  Randy Garrett ZOX:[id number] DOB: [birth year] DOA: 04/14/2012  PCP: Syliva Overman, MD  Admit date: 04/14/2012 Discharge date: 04/20/2012  Recommendations for Outpatient Follow-up:  1. Patient's hypertension is to be closely monitored. 2. Renal function needs to be closely monitored. 3. Home health the use of her RN  to monitor blood pressure. 4. Will need outpatient appointment with Dr. Darvin Neighbours, urology at [facility].  Discharge Diagnoses:    1. Acute renal failure, likely on chronic kidney disease, improved with hydration and better blood pressure control. 2. Uncontrolled hypertension. 3. Multiple cysts in the left greater than right kidney. 4. Left ureteric stone 15 mm at the left ureteral pelvic junction. 5. Systolic murmur, mild aortic stenosis on echocardiogram.  Discharge Condition:  Stable.  Diet recommendation:  Heart healthy.  History of present illness:  This 71 year old man came into the hospital because of abnormal blood work from his PCP. He was found to be in acute renal failure with a creatinine of 3.25. The etiology of this wasn't initially not clear. He was hydrated with IV fluids as he did look clinically dehydrated. He was then further investigated with CT abdominal and followed by MRI abdominal scan. The results are above, showing multiple cysts in the left kidney but also in the right kidney. On the left kidney there is suspicious exophytic cysts which need to be further investigated. Nephrology was involved in his care as well as urology. Since his renal function is now much improved and his blood pressure also somewhat improved, is stable for discharge. He does clearly need outpatient followup of the renal cysts especially on the left side, with a view to cystic aspiration of one of the cyst in looking for cytology for malignancy. The family would prefer a urologist at [facility] and they apparently know, Dr. Darvin Neighbours.  Hospital  Course:  As above.  Procedures:   None.  Consultations:   Nephrology, Dr Fausto Skillern.  Urology, Dr. Jerre Simon.  Discharge Exam: Filed Vitals:   04/20/12 0510  BP: 162/85  Pulse: 63  Temp: 99.8 F (37.7 C)  Resp: 22   Filed Vitals:   04/19/12 0454 04/19/12 1348 04/19/12 2123 04/20/12 0510  BP: 184/92 139/68 171/85 162/85  Pulse: 72 53 72 63  Temp: 97.9 F (36.6 C) 98.4 F (36.9 C) 98.6 F (37 C) 99.8 F (37.7 C)  TempSrc: Oral Oral Oral Oral  Resp: 24 22 22 22   Height:      Weight:      SpO2: 97% 95% 94% 93%   General:  Looks systemically well. Cardiovascular:  Heart sounds are present and normal. Systolic murmur as heard before. He is clinically not in heart failure. Respiratory:  Lung fields are clear with the occasional wheezing which is his baseline. He is alert and orientated. His abdomen is somewhat distended due to constipation which should resolve with simple measures such as laxative and enema which we will give him now.  Discharge Instructions  Discharge Orders    Future Appointments: Provider: Department: Dept Phone: Center:   04/22/2012 8:00 AM Kerri Perches, MD [facility] [phone] [facility]     Future Orders Please Complete By Expires   Diet - low sodium heart healthy      Increase activity slowly        Medication List  As of 04/20/2012  8:34 AM   STOP taking these medications         atenolol 25 MG tablet  lisinopril 20 MG tablet         TAKE these medications         acetaminophen 325 MG tablet   Commonly known as: TYLENOL   Take 650 mg by mouth daily as needed. pain      amLODipine 10 MG tablet   Commonly known as: NORVASC   Take 1 tablet (10 mg total) by mouth daily.      cloNIDine 0.3 MG tablet   Commonly known as: CATAPRES   Take 1 tablet (0.3 mg total) by mouth 2 (two) times daily.      Fish Oil 1000 MG Caps   Take 2 capsules by mouth daily.      Garlic 1000 MG Caps   Take by mouth.      gemfibrozil 600 MG  tablet   Commonly known as: LOPID   Take 1 tablet (600 mg total) by mouth 2 (two) times daily before a meal.      metoprolol succinate 100 MG 24 hr tablet   Commonly known as: TOPROL-XL   Take 1 tablet (100 mg total) by mouth daily. Take with or immediately following a meal.      oxyCODONE-acetaminophen 5-325 MG per tablet   Commonly known as: PERCOCET   Take 1 tablet by mouth every 4 (four) hours as needed.      phenazopyridine 100 MG tablet   Commonly known as: PYRIDIUM   Take 1 tablet (100 mg total) by mouth 3 (three) times daily with meals.      senna-docusate 8.6-50 MG per tablet   Commonly known as: Senokot-S   Take 1 tablet by mouth 2 (two) times daily as needed.      simvastatin 20 MG tablet   Commonly known as: ZOCOR   Take 1 tablet (20 mg total) by mouth at bedtime.      vitamin E 400 UNIT capsule   Take 800 Units by mouth daily. 2 tabs daily           Follow-up Information    Follow up with Syliva Overman, MD. Schedule an appointment as soon as possible for a visit in 1 week.   Contact information:   [address], [address] [place] [address] [phone]           The results of significant diagnostics from this hospitalization (including imaging, microbiology, ancillary and laboratory) are listed below for reference.    Significant Diagnostic Studies: Ct Abdomen Pelvis Wo Contrast  04/17/2012  *RADIOLOGY REPORT*  Clinical Data: Left flank pain.  Left hydronephrosis.  CT ABDOMEN AND PELVIS WITHOUT CONTRAST  Technique:  Multidetector CT imaging of the abdomen and pelvis was performed following the standard protocol without intravenous contrast.  Comparison: Ultrasound dated 04/15/2012  Findings: There are tiny bilateral pleural effusions.  There is an irregular 15 mm stone obstructing the left renal pelvis.  There is secondary left hydronephrosis.  The distal ureter is normal.  There is perinephric soft tissue stranding.  The patient has  multiple cysts in the left kidney as demonstrated on prior ultrasound.  There is a complex left lower pole 4.3 cm cyst with irregular linear areas of high density which could represent hemorrhage. This cyst appeared simple on ultrasound but was not well seen.  There are calcifications adjacent to the cystic duct but these do not appear to be within the cystic duct and probably represent vascular calcifications.  The liver, spleen, pancreas, and adrenal glands are normal.  There  are cysts on the right kidney with a 1 mm calcification in the mid right kidney.  The bowel appears normal.  No acute osseous abnormality.  Old compression fracture of the superior endplate of T12.  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  Extensive calcification of the abdominal aorta and iliac arteries.  IMPRESSION:  1.  15 mm stone obstructing the left ureteral pelvic junction. I suspect this may be secondary to chronic infection. 2.  Complex cystic lesion on the lower pole of the left kidney, probably representing a hemorrhagic cyst.  Because of the patient's impaired renal function, the best method for further assessment would be MRI of the kidneys without contrast.  Original Report Authenticated By: Gwynn Burly, M.D.   Mr Abdomen Wo Contrast  04/17/2012  *RADIOLOGY REPORT*  Clinical Data: Complex cystic mass in the left kidney.  MRI ABDOMEN WITHOUT CONTRAST  Technique:  Multiplanar multisequence MR imaging of the abdomen was performed. No intravenous contrast was administered.  Comparison: 04/17/2012  Findings: The patient was unable to suspend respirations, resulting in motion artifact.  IV contrast cannot be employed due to renal insufficiency (GFR 26 ml/minute).  The breathing motion causes ghosting artifact and significant reduction in signal.  Bilateral cystic lesions are as shown on recent CT scan.  The complex cystic lesion of the left kidney lower pole has primarily high T2 and low T1 signal characteristics similar  to the other cyst in this vicinity, but demonstrates a definite complexity and internal low T2 signal on images 23-18 of series 2.  Obvious internal flow was not shown in this lesion on the ultrasound from 04/15/2012, although gain settings are uncertain in this does not to definitively in dictate benign complex lesion.  Multiple additional cysts bilaterally have high T2 and low T1 signal characteristics.  Somewhat heterogeneous signal intensity along the left kidney lower pole renal hilum is observed, potentially representing complex parapelvic cysts, or filling defect in the lower pole collecting system which might include blood products, fungus ball, for tumor.  A left UPJ calculus is shown on image 21 of series 2.  There is thought to be a single septation in the 5.5 cm left kidney upper pole dominant cyst.  Indistinct tissue along the left upper pole renal hilum is noted, probably representing edematous adipose tissue due to the perirenal stranding.  This is shown on images 23- 24 of series 2  Visualized right renal cyst appears simple on noncontrast MRI.  The gallbladder and biliary system appear unremarkable.  No additional significant abnormality observed.  IMPRESSION:  1.  Complex cystic lesion exophytic from the left kidney lower pole could represent a hemorrhagic cyst or cystic renal cell carcinoma. Without IV contrast, differentiation by imaging is problematic and may require close follow-up imaging. 2.  Indistinct heterogeneous signal in the left kidney lower pole collecting system region could represent complex parapelvic cysts, blood products in the collecting system, fungus ball, or conceivably tumor. Ureterogram may be warranted following ordering treatment of the left UPJ calculus in order to exclude urothelial tumor.  3.  Obstructive left UPJ calculus. 4.  Septated but otherwise benign cyst of the left kidney upper pole. 5.  Multiple additional cysts are present bilaterally in the kidneys. 6.   Significantly reduced diagnostic sensitivity and specificity primarily secondary to breathing motion.  Original Report Authenticated By: Dellia Cloud, M.D.   US Renal  04/15/2012  *RADIOLOGY REPORT*  Clinical Data: Acute renal failure.  RENAL/URINARY TRACT ULTRASOUND COMPLETE  Comparison:  None.  Findings:  Right Kidney:  11.4 cm in length.  Renal parenchyma is slightly echogenic suggesting renal medical disease.  2.4 cm simple cyst on the lateral aspect of the right kidney.  No obstruction.  Left Kidney:  12.2 cm in length.  Slight dilatation of the infundibuli and calyces.  Renal pelvis does not appear distended. 1.7 cm echogenic lesion in the right renal pelvis may represent a stone.   4.3 cm cyst on the upper pole.  3.6 cm cyst on the lower pole.  Bladder:  Right ureteral jet is identified.  No left ureteral jet.  IMPRESSION:  1.  Probable obstruction of the left kidney at the left renal pelvis by a 1.7 cm stone. 2.  Benign-appearing cysts bilaterally.  Original Report Authenticated By: Gwynn Burly, M.D.    Microbiology: Recent Results (from the past 240 hour(s))  URINE CULTURE     Status: Normal   Collection Time   04/18/12  1:36 PM      Component Value Range Status Comment   Specimen Description URINE, CLEAN CATCH   Final    Special Requests Normal   Final    Culture  Setup Time 04/18/2012 21:39   Final    Colony Count NO GROWTH   Final    Culture NO GROWTH   Final    Report Status 04/19/2012 FINAL   Final      Labs: Basic Metabolic Panel:  Lab 04/20/12 5621 04/19/12 0645 04/18/12 0610 04/17/12 0620 04/16/12 0618 04/15/12 1216  NA 142 139 138 139 142 --  K 3.9 3.2* 3.7 4.1 4.6 --  CL 112 108 112 113* 113* --  CO2 20 21 16* 17* 20 --  GLUCOSE 97 100* 97 92 86 --  BUN 24* 25* 27* 33* 35* --  CREATININE 1.86* 2.33* 2.22* 2.38* 2.57* --  CALCIUM 9.2 8.9 8.7 8.8 9.0 --  MG -- -- -- -- -- --  PHOS -- 2.5 -- -- 2.5 3.2   Liver Function Tests:  Lab 04/19/12 0645  04/15/12 0543 04/14/12 1522  AST 49* 17 22  ALT 51 17 22  ALKPHOS 79 76 100  BILITOT 0.2* 0.3 0.3  PROT 6.2 6.7 8.1  ALBUMIN 2.7* 3.0* 3.7     CBC:  Lab 04/19/12 0645 04/17/12 0620 04/15/12 0543 04/14/12 1522  WBC 8.8 9.0 9.4 12.7*  NEUTROABS -- -- -- 9.2*  HGB 12.8* 13.4 13.5 15.3  HCT 36.4* 38.7* 38.3* 42.9  MCV 89.2 91.7 89.7 89.9  PLT 179 155 167 173      Time coordinating discharge:  Greater than 30 minutes.  SignedWilson Singer  [facility] 04/20/2012, 8:34 AM

## 2012-04-22 ENCOUNTER — Ambulatory Visit: Payer: Medicare Other | Admitting: Family Medicine

## 2012-04-22 ENCOUNTER — Telehealth: Payer: Self-pay | Admitting: Family Medicine

## 2012-04-24 NOTE — Telephone Encounter (Signed)
Noted  

## 2012-04-24 NOTE — Telephone Encounter (Signed)
Pt to stay on medication he is discharged on till seen in the office

## 2012-04-27 ENCOUNTER — Ambulatory Visit: Payer: Medicare Other | Admitting: Family Medicine

## 2012-06-18 DIAGNOSIS — I2510 Atherosclerotic heart disease of native coronary artery without angina pectoris: Secondary | ICD-10-CM | POA: Insufficient documentation

## 2012-06-18 DIAGNOSIS — I10 Essential (primary) hypertension: Secondary | ICD-10-CM | POA: Insufficient documentation

## 2012-06-18 DIAGNOSIS — Z803 Family history of malignant neoplasm of breast: Secondary | ICD-10-CM | POA: Insufficient documentation

## 2012-06-18 DIAGNOSIS — Z7982 Long term (current) use of aspirin: Secondary | ICD-10-CM | POA: Insufficient documentation

## 2012-06-18 DIAGNOSIS — L509 Urticaria, unspecified: Secondary | ICD-10-CM | POA: Insufficient documentation

## 2012-06-18 DIAGNOSIS — Z87891 Personal history of nicotine dependence: Secondary | ICD-10-CM | POA: Insufficient documentation

## 2012-06-19 ENCOUNTER — Encounter (HOSPITAL_COMMUNITY): Payer: Self-pay | Admitting: Emergency Medicine

## 2012-06-19 ENCOUNTER — Emergency Department (HOSPITAL_COMMUNITY)
Admission: EM | Admit: 2012-06-19 | Discharge: 2012-06-19 | Disposition: A | Discharge status: Home / Self Care | Payer: Medicare Other | Attending: Emergency Medicine | Admitting: Emergency Medicine

## 2012-06-19 DIAGNOSIS — L509 Urticaria, unspecified: Secondary | ICD-10-CM

## 2012-06-19 HISTORY — DX: Benign prostatic hyperplasia without lower urinary tract symptoms: N40.0

## 2012-06-19 HISTORY — DX: Diverticulitis of intestine, part unspecified, without perforation or abscess without bleeding: K57.92

## 2012-06-19 MED ORDER — PREDNISONE 10 MG PO TABS: 20.0000 mg | ORAL_TABLET | Freq: Every day | ORAL | Status: DC

## 2012-06-19 MED ORDER — FAMOTIDINE 20 MG PO TABS
20.0000 mg | ORAL_TABLET | Freq: Once | ORAL | Status: AC
  Administered 2012-06-19: 20 mg via ORAL
  Filled 2012-06-19: qty 1

## 2012-06-19 MED ORDER — PREDNISONE 20 MG PO TABS
60.0000 mg | ORAL_TABLET | Freq: Once | ORAL | Status: AC
  Administered 2012-06-19: 60 mg via ORAL
  Filled 2012-06-19: qty 3

## 2012-06-19 NOTE — ED Notes (Signed)
Patient complaining of rash that started yesterday and has progressed since. Complaining of itching as well.

## 2012-06-19 NOTE — ED Provider Notes (Signed)
History     CSN: [id number]  Arrival date & time 06/18/12  2349   First MD Initiated Contact with Patient 06/19/12 0013      Chief Complaint  Patient presents with  . Rash    (Consider location/radiation/quality/duration/timing/severity/associated sxs/prior treatment) HPI MACDONALD RAZZAK is a 71 y.o. male who presents to the Emergency Department complaining of hives that began yesterday and has recurred today. There has been no associated shortness of breath, facial swelling, fever, chills. No known exposures. He has taken benadryl 50 mg at 11 PM.  PCP  Dr. Lodema Hong Past Medical History  Diagnosis Date  . Hyperlipidemia   . Hypertension   . CAD (coronary artery disease)   . Goiter   . Renal disorder     kidney stone  . Enlarged prostate   . Diverticulitis     Past Surgical History  Procedure Date  . Tonsillectomy   . Diverticulitis 1964  . Appendectomy   . Lithotripsy     Family History  Problem Relation Age of Onset  . Cancer Mother     breast    History  Substance Use Topics  . Smoking status: Former Games developer  . Smokeless tobacco: Not on file  . Alcohol Use: No      Review of Systems  Constitutional: Negative for fever.       10 Systems reviewed and are negative for acute change except as noted in the HPI.  HENT: Negative for congestion.   Eyes: Negative for discharge and redness.  Respiratory: Negative for cough and shortness of breath.   Cardiovascular: Negative for chest pain.  Gastrointestinal: Negative for vomiting and abdominal pain.  Musculoskeletal: Negative for back pain.  Skin: Negative for rash.       hives  Neurological: Negative for syncope, numbness and headaches.  Psychiatric/Behavioral:       No behavior change.    Allergies  Review of patient's allergies indicates no known allergies.  Home Medications   Current Outpatient Rx  Name Route Sig Dispense Refill  . ASPIRIN 325 MG PO TABS Oral Take 325 mg by mouth daily.    Marland Kitchen  VITAMIN D 1000 UNITS PO TABS Oral Take 1,000 Units by mouth 2 (two) times daily.    . MULTI-VITAMIN/MINERALS PO TABS Oral Take 1 tablet by mouth daily.    . ACETAMINOPHEN 325 MG PO TABS Oral Take 650 mg by mouth daily as needed. pain    . AMLODIPINE BESYLATE 10 MG PO TABS Oral Take 1 tablet (10 mg total) by mouth daily. 30 tablet 0  . CLONIDINE HCL 0.3 MG PO TABS Oral Take 1 tablet (0.3 mg total) by mouth 2 (two) times daily. 60 tablet 0  . GARLIC 1000 MG PO CAPS Oral Take by mouth.      . GEMFIBROZIL 600 MG PO TABS Oral Take 1 tablet (600 mg total) by mouth 2 (two) times daily before a meal. 180 tablet 1  . METOPROLOL SUCCINATE ER 100 MG PO TB24 Oral Take 1 tablet (100 mg total) by mouth daily. Take with or immediately following a meal. 30 tablet 0  . FISH OIL 1000 MG PO CAPS Oral Take 2 capsules by mouth daily.      Bernadette Hoit SODIUM 8.6-50 MG PO TABS Oral Take 1 tablet by mouth 2 (two) times daily as needed. 30 tablet 0  . SIMVASTATIN 20 MG PO TABS Oral Take 1 tablet (20 mg total) by mouth at bedtime. 90 tablet 1  .  VITAMIN E 400 UNITS PO CAPS Oral Take 800 Units by mouth daily. 2 tabs daily      BP 160/63  Pulse 76  Temp 98.2 F (36.8 C) (Oral)  Resp 16  Ht 5\' 9"  (1.753 m)  Wt 208 lb (94.348 kg)  BMI 30.72 kg/m2  SpO2 98%  Physical Exam  Nursing note and vitals reviewed. Constitutional: He appears well-developed and well-nourished.       Awake, alert, nontoxic appearance.  HENT:  Head: Atraumatic.  Eyes: Right eye exhibits no discharge. Left eye exhibits no discharge.  Neck: Neck supple.  Cardiovascular: Normal heart sounds.   Pulmonary/Chest: Effort normal and breath sounds normal. He exhibits no tenderness.  Abdominal: Soft. There is no tenderness. There is no rebound.  Musculoskeletal: He exhibits no tenderness.       Baseline ROM, no obvious new focal weakness.  Neurological:       Mental status and motor strength appears baseline for patient and situation.    Skin: No rash noted.       Hives to thighs and abdomen.  Psychiatric: He has a normal mood and affect.    ED Course  Procedures (including critical care time)  Labs Reviewed - No data to display No results found.   No diagnosis found.    MDM  Patient with non specific allergic reaction characterized as hives. He had taken benadryl PTA. Given pepcid and prednisone with improvement in hives.  Patient observed without worsening or developing airway compromise.Marland KitchenPt feels improved after observation and/or treatment in ED.Pt stable in ED with no significant deterioration in condition.The patient appears reasonably screened and/or stabilized for discharge and I doubt any other medical condition or other [facility] requiring further screening, evaluation, or treatment in the ED at this time prior to discharge.  MDM Reviewed: nursing note and vitals Total time providing critical care: 30 minutes.           Nicoletta Dress. Colon Branch, MD 06/19/12 [phone]

## 2012-06-25 ENCOUNTER — Encounter: Payer: Self-pay | Admitting: Family Medicine

## 2012-06-25 ENCOUNTER — Ambulatory Visit (INDEPENDENT_AMBULATORY_CARE_PROVIDER_SITE_OTHER): Payer: Medicare Other | Admitting: Family Medicine

## 2012-06-25 VITALS — BP 140/90 | HR 56 | Resp 18 | Ht 69.0 in | Wt 204.1 lb

## 2012-06-25 DIAGNOSIS — I10 Essential (primary) hypertension: Secondary | ICD-10-CM

## 2012-06-25 DIAGNOSIS — Z23 Encounter for immunization: Secondary | ICD-10-CM

## 2012-06-25 DIAGNOSIS — N200 Calculus of kidney: Secondary | ICD-10-CM

## 2012-06-25 DIAGNOSIS — N189 Chronic kidney disease, unspecified: Secondary | ICD-10-CM

## 2012-06-25 DIAGNOSIS — E785 Hyperlipidemia, unspecified: Secondary | ICD-10-CM

## 2012-06-25 LAB — COMPLETE METABOLIC PANEL WITH GFR
ALT: 14 U/L (ref 0–53)
CO2: 29 mEq/L (ref 19–32)
Calcium: 9.6 mg/dL (ref 8.4–10.5)
Chloride: 100 mEq/L (ref 96–112)
Creat: 1.09 mg/dL (ref 0.50–1.35)
GFR, Est African American: 79 mL/min

## 2012-06-25 NOTE — Progress Notes (Signed)
  Subjective:    Patient ID: Randy Garrett, male    DOB: 25-Oct-1940, 71 y.o.   MRN: [id number]  HPI Pt in for follow up following an eventful mid Summer which started with hospitalization for acute renal failure, unclear etiology. Pt has had lithotripsy per his reporting at [facility] since this also. 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 Denies sinus pressure, nasal congestion, ear pain or sore throat. Denies chest congestion, productive cough or wheezing. Denies chest pains, palpitations and leg swelling Denies abdominal pain, nausea, vomiting,diarrhea or constipation.   Denies dysuria, frequency, hesitancy or incontinence. 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 Denies depression, anxiety or insomnia. Denies skin break down or rash.         Objective:   Physical Exam Patient alert and oriented and in no cardiopulmonary distress.  HEENT: No facial asymmetry, EOMI, no sinus tenderness,  oropharynx pink and moist.  Neck decreased though adequate ROM, no adenopathy.  Chest: Clear to auscultation bilaterally.  CVS: S1, S2 no murmurs, no S3.  ABD: Soft non tender. Bowel sounds normal.  Ext: No edema  WU:[id number]  ROM spine, shoulders, hips and knees.  Skin: Intact, no ulcerations or rash noted.  Psych: Good eye contact, normal affect. Memory  Mildly decreased  not anxious or depressed appearing.  CNS: CN 2-12 intact      Assessment & Plan:

## 2012-06-25 NOTE — Patient Instructions (Addendum)
F/u in January, annual wellness, call if you need me before  Flu vaccine today.   Cmp and EGfR stat today.  No med changes and 6 month meds will be sent [facility] per your request.  PSA normal  You are referred to a kidney specialist  Chem 7 first week in [place]

## 2012-06-26 ENCOUNTER — Encounter: Payer: Self-pay | Admitting: Family Medicine

## 2012-06-26 MED ORDER — AMLODIPINE BESYLATE 10 MG PO TABS: 10.0000 mg | ORAL_TABLET | Freq: Every day | ORAL | Status: DC

## 2012-06-26 MED ORDER — SIMVASTATIN 20 MG PO TABS: 20.0000 mg | ORAL_TABLET | Freq: Every day | ORAL | Status: DC

## 2012-06-26 MED ORDER — CLONIDINE HCL 0.3 MG PO TABS: 0.3000 mg | ORAL_TABLET | Freq: Two times a day (BID) | ORAL | Status: DC

## 2012-06-26 MED ORDER — METOPROLOL SUCCINATE ER 100 MG PO TB24: 100.0000 mg | ORAL_TABLET | Freq: Every day | ORAL | Status: DC

## 2012-06-26 MED ORDER — GEMFIBROZIL 600 MG PO TABS: 600.0000 mg | ORAL_TABLET | Freq: Two times a day (BID) | ORAL | Status: DC

## 2012-06-26 NOTE — Assessment & Plan Note (Signed)
Recurrent hospitaliztion for renal failure of unknown etiology, asymptomatic. Will have closer follow up of labs, also referred to nephrologist for ongoing care

## 2012-06-26 NOTE — Assessment & Plan Note (Signed)
Hyperlipidemia:Low fat diet discussed and encouraged.  Controlled, no change in medication   

## 2012-06-26 NOTE — Assessment & Plan Note (Signed)
Managed at [facility]

## 2012-06-26 NOTE — Addendum Note (Signed)
Addended by: Kandis Fantasia B on: 06/26/2012 04:14 PM   Modules accepted: Orders, Medications

## 2012-06-26 NOTE — Assessment & Plan Note (Signed)
Sub optimal control, no med change at this time 

## 2012-07-13 ENCOUNTER — Other Ambulatory Visit: Payer: Self-pay

## 2012-07-13 MED ORDER — METOPROLOL SUCCINATE ER 100 MG PO TB24: 100.0000 mg | ORAL_TABLET | Freq: Every day | ORAL | Status: DC

## 2012-07-13 MED ORDER — AMLODIPINE BESYLATE 10 MG PO TABS: 10.0000 mg | ORAL_TABLET | Freq: Every day | ORAL | Status: DC

## 2012-07-13 MED ORDER — SIMVASTATIN 20 MG PO TABS
20.0000 mg | ORAL_TABLET | Freq: Every day | ORAL | Status: DC
Start: 2012-07-13 — End: 2013-04-08

## 2012-07-13 MED ORDER — CLONIDINE HCL 0.3 MG PO TABS: 0.3000 mg | ORAL_TABLET | Freq: Two times a day (BID) | ORAL | Status: DC

## 2012-07-13 MED ORDER — GEMFIBROZIL 600 MG PO TABS: 600.0000 mg | ORAL_TABLET | Freq: Two times a day (BID) | ORAL | Status: DC

## 2012-10-08 ENCOUNTER — Ambulatory Visit (INDEPENDENT_AMBULATORY_CARE_PROVIDER_SITE_OTHER): Payer: Medicare Other | Admitting: Family Medicine

## 2012-10-08 ENCOUNTER — Other Ambulatory Visit: Payer: Self-pay | Admitting: Family Medicine

## 2012-10-08 ENCOUNTER — Encounter: Payer: Self-pay | Admitting: Family Medicine

## 2012-10-08 VITALS — BP 118/74 | HR 67 | Resp 16 | Ht 69.0 in | Wt 210.4 lb

## 2012-10-08 DIAGNOSIS — E785 Hyperlipidemia, unspecified: Secondary | ICD-10-CM

## 2012-10-08 DIAGNOSIS — Z0000 Encounter for general adult medical examination without abnormal findings: Secondary | ICD-10-CM

## 2012-10-08 DIAGNOSIS — I350 Nonrheumatic aortic (valve) stenosis: Secondary | ICD-10-CM

## 2012-10-08 DIAGNOSIS — Z8673 Personal history of transient ischemic attack (TIA), and cerebral infarction without residual deficits: Secondary | ICD-10-CM

## 2012-10-08 DIAGNOSIS — L918 Other hypertrophic disorders of the skin: Secondary | ICD-10-CM | POA: Insufficient documentation

## 2012-10-08 DIAGNOSIS — I359 Nonrheumatic aortic valve disorder, unspecified: Secondary | ICD-10-CM

## 2012-10-08 DIAGNOSIS — L909 Atrophic disorder of skin, unspecified: Secondary | ICD-10-CM

## 2012-10-08 DIAGNOSIS — I10 Essential (primary) hypertension: Secondary | ICD-10-CM

## 2012-10-08 LAB — LIPID PANEL
LDL Cholesterol: 94 mg/dL (ref 0–99)
Triglycerides: 53 mg/dL (ref <150)

## 2012-10-08 MED ORDER — GEMFIBROZIL 600 MG PO TABS: 600.0000 mg | ORAL_TABLET | Freq: Two times a day (BID) | ORAL | Status: DC

## 2012-10-08 NOTE — Assessment & Plan Note (Signed)
Removed in office

## 2012-10-08 NOTE — Progress Notes (Signed)
Patient ID: Randy Garrett, male   DOB: 1940-11-03, 72 y.o.   MRN: [id number] Patient here for annual wellness exam with PCP. He has a large skin lesion on his left buttocks that has been bothering him and has been growing. He would like to have this removed today. No history of skin cancer  Left buttocks- 1cm pedunculated acrochordon, irritated appearance,    Procedure- Skin lesion removal Procedure explained to patient questions answered benefits and risks discussed written consent obtained. Antiseptic-Betadine Anesthesia-Xylocaine 1%  Lesion removed at bedside- sent for pathology Minimal blood loss Silver nitrate stick used for coagulation Patient tolerated procedure well Bandage applied with triple antibiotic ointment Discussed after care, remove bandage after 24 hours Signs of infection- redness, swelling, discharge  Pt and wife voiced understanding

## 2012-10-08 NOTE — Assessment & Plan Note (Signed)
Annual wellness exam completed as documented . Pt is very functional, memory intact , not depressed, limited in mobility, but is cautious , uses assistive device. Has severe right knee arthritis, also right extremity weakness due to previous stroke End of life issues are discussed , a full code and wants resucitative efforts to prolong life as deemed to be necessary. I have encouraged him to name a health care POA and also to discuss these issues with his adult children since he is a divorcee in a current longstanding relationship. His partner is with him, she agrees , and fully understands

## 2012-10-08 NOTE — Patient Instructions (Addendum)
F/U in 5.5 month   Fasting lipid, cmp  And EGFr today   Be careful about falls.  You will be referred for removal of skin tag  Please sign for records from [facility] Dr Lestine Mount and d/c instructions from the hosppital

## 2012-10-08 NOTE — Progress Notes (Signed)
Subjective:    Patient ID: Randy Garrett, male    DOB: Jul 03, 1941, 72 y.o.   MRN: [id number]  HPI Preventive Screening-Counseling & Management   Patient present here today for a Medicare annual wellness visit. Pt also has an inflamed skin tag on the lower back which he requested be removed on the day of the visit. Dr. Jeanice Lim removed the skin tag, and has documented separately on the procedure after obtaining informed consent form the patient. There were no complications from the procedure   Current Problems (verified)   Medications Prior to Visit Allergies (verified)   PAST HISTORY  Social History Separated, lives with his partener, father of 4 adult children. Was in the [employer] for 4 years, retired from Herbalist, Estate manager/land agent, Merchandiser, retail. Cigarettes  Quit in 1995 after stroke, smoked approx 1 PPD for 20 tears.   Alcohol up to 1983 2 beer per day No illicit drugs  Family history:Mother died of breast of cancer age 66, no other known family illness, he is an only child   Risk Factors  Current exercise habits:YMCa sporadic, is comiting to 3  To 5 days per week   Dietary issues discussed:Increased vegetable and fruit , reduce carbs and sweets   Cardiac risk factors: vascular  Depression Screen  (Note: if answer to either of the following is "Yes", a more complete depression screening is indicated)   Over the past two weeks, have you felt down, depressed or hopeless? No  Over the past two weeks, have you felt little interest or pleasure in doing things? No  Have you lost interest or pleasure in daily life? No  Do you often feel hopeless? No  Do you cry easily over simple problems? No   Activities of Daily Living  In your present state of health, do you have any difficulty performing the following activities?  Driving?: No Managing money?: No Feeding yourself?:No Getting from bed to chair?:difficult Climbing a flight of stairs?:difficult Preparing food  and eating?:No Bathing or showering?:No Getting dressed?:No Getting to the toilet?:No Using the toilet?:No Moving around from place to place?: ambulates with  Cane 100%, quad cane 30% of the time eg a Church  Or walking for an extended period  Fall Risk Assessment In the past year have you fallen or had a near fall?:No Are you currently taking any medications that make you dizziness?:No   Hearing Difficulties: No Do you often ask people to speak up or repeat themselves?:No Do you experience ringing or noises in your ears?:No Do you have difficulty understanding soft or whispered voices?:No  Cognitive Testing  Alert? Yes Normal Appearance?Yes  Oriented to person? Yes Place? Yes  Time? Yes  Displays appropriate judgment?Yes  Can read the correct time from a watch face? yes Are you having problems remembering things?No  Advanced Directives have been discussed with the patient?Yes    List the Names of Other Physician/Practitioners you currently use: Urology  at [facility] only   Indicate any recent Medical Services you may have received from other than [facility] providers in the past year (date may be approximate).   Assessment:    Annual Wellness Exam   Plan:    During the course of the visit the patient was educated and counseled about appropriate screening and preventive services including:  A healthy diet is rich in fruit, vegetables and whole grains. Poultry fish, nuts and beans are a healthy choice for protein rather then red meat. A low sodium diet and drinking 64 ounces of water  daily is generally recommended. Oils and sweet should be limited. Carbohydrates especially for those who are diabetic or overweight, should be limited to 30-45 gram per meal. It is important to eat on a regular schedule, at least 3 times daily. Snacks should be primarily fruits, vegetables or nuts. It is important that you exercise regularly at least 30 minutes 5 times a week. If you develop chest pain,  have severe difficulty breathing, or feel very tired, stop exercising immediately and seek medical attention  Immunization reviewed and updated. Cancer screening reviewed and updated    Patient Instructions (the written plan) was given to the patient.  Medicare Attestation  I have personally reviewed:  The patient's medical and social history  Their use of alcohol, tobacco or illicit drugs  Their current medications and supplements  The patient's functional ability including ADLs,fall risks, home safety risks, cognitive, and hearing and visual impairment  Diet and physical activities  Evidence for depression or mood disorders  The patient's weight, height, BMI, and visual acuity have been recorded in the chart. I have made referrals, counseling, and provided education to the patient based on review of the above and I have provided the patient with a written personalized care plan for preventive services.      Review of Systems     Objective:   Physical Exam        Assessment & Plan:

## 2012-10-09 LAB — COMPLETE METABOLIC PANEL WITH GFR
ALT: 11 U/L (ref 0–53)
Albumin: 3.5 g/dL (ref 3.5–5.2)
CO2: 28 mEq/L (ref 19–32)
Calcium: 8.8 mg/dL (ref 8.4–10.5)
Chloride: 106 mEq/L (ref 96–112)
GFR, Est African American: 79 mL/min
Sodium: 143 mEq/L (ref 135–145)
Total Protein: 6.6 g/dL (ref 6.0–8.3)

## 2012-10-09 IMAGING — CT CT ABD-PELV W/O CM
2 of 3 series · 16 of 46 positions shown, 18 images · non-contrast
Comparison: Ultrasound dated 04/15/2012

CLINICAL DATA: Left flank pain.  Left hydronephrosis.

CT ABDOMEN AND PELVIS WITHOUT CONTRAST
TECHNIQUE: Multidetector CT imaging of the abdomen and pelvis was
performed following the standard protocol without intravenous
contrast.

[Series 2: standard/full over (age)lbs 5.0 · axial · 0.76mm/px · z∈[-422,-17]mm · 13 of 95 slices shown, 15 images]
[im 7/95  soft-tissue]
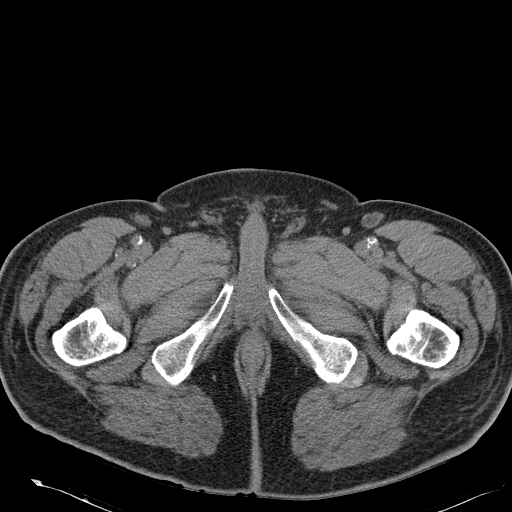
[im 7/95  bone]
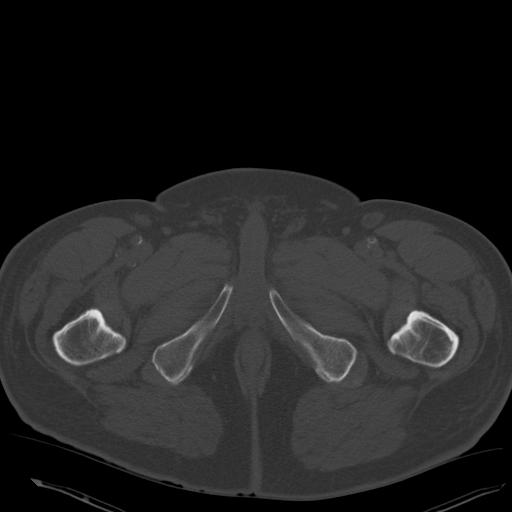
[im 13/95  soft-tissue]
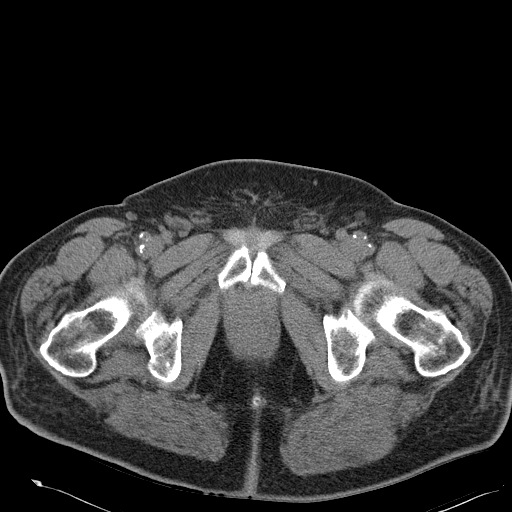
[im 19/95  soft-tissue]
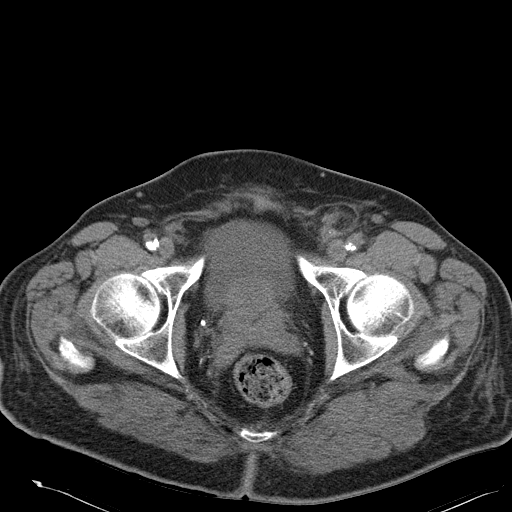
[im 28/95  soft-tissue]
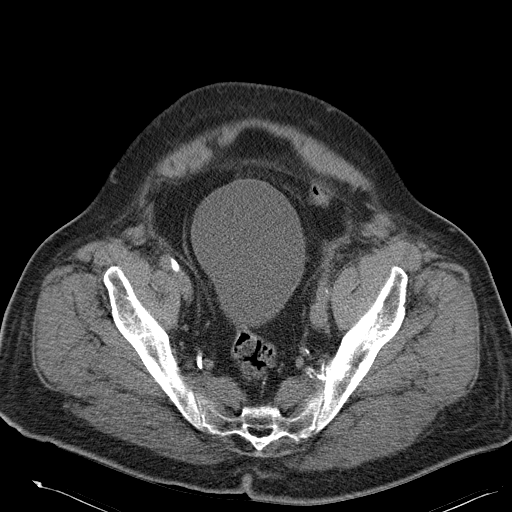
[im 34/95  soft-tissue]
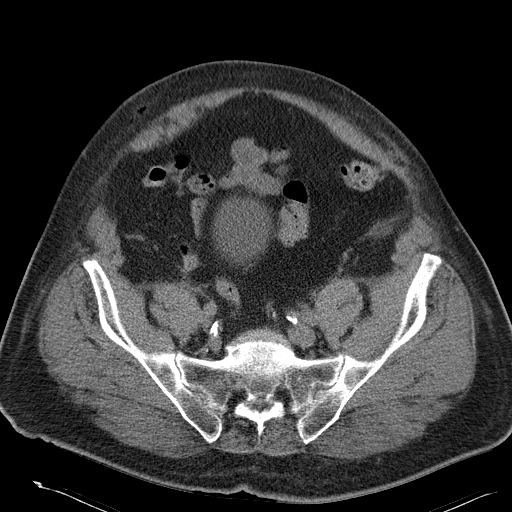
[im 40/95  soft-tissue]
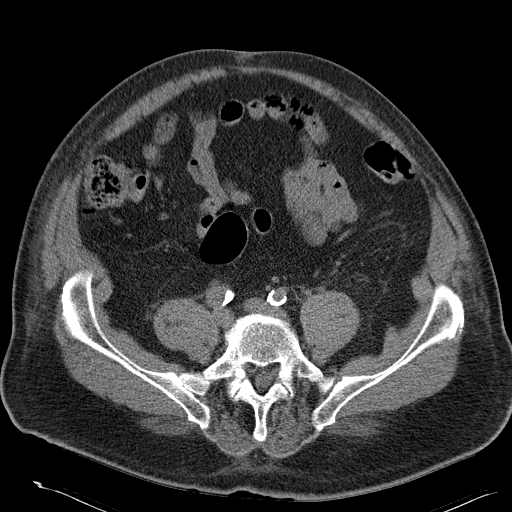
[im 49/95  soft-tissue]
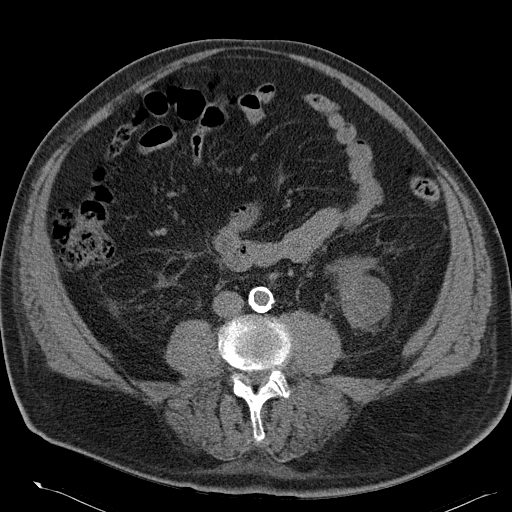
[im 55/95  soft-tissue]
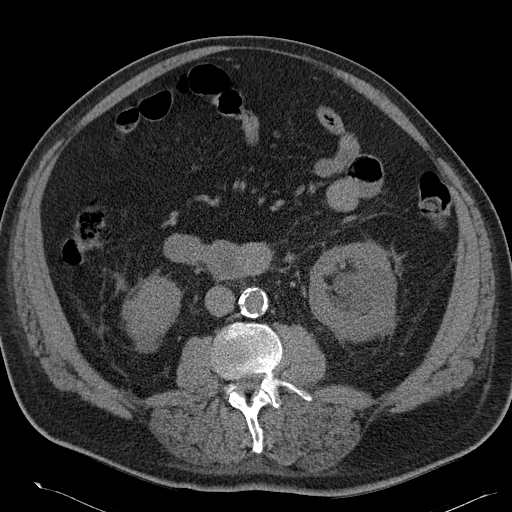
[im 61/95  soft-tissue]
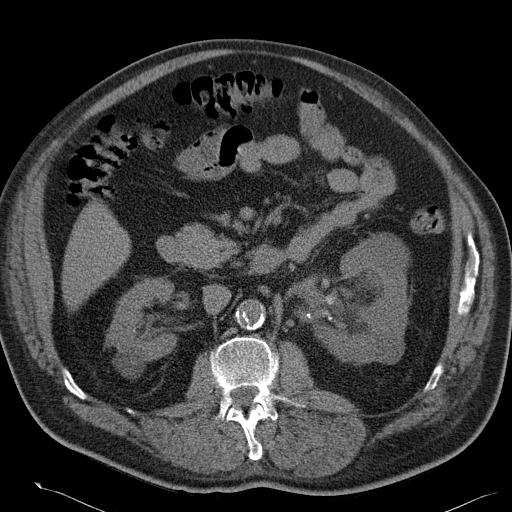
[im 61/95  bone]
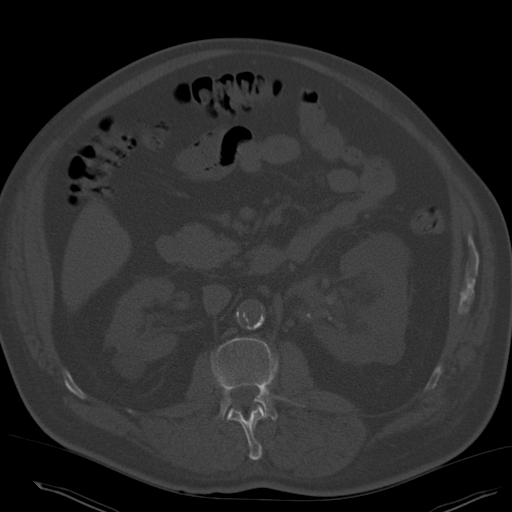
[im 67/95  soft-tissue]
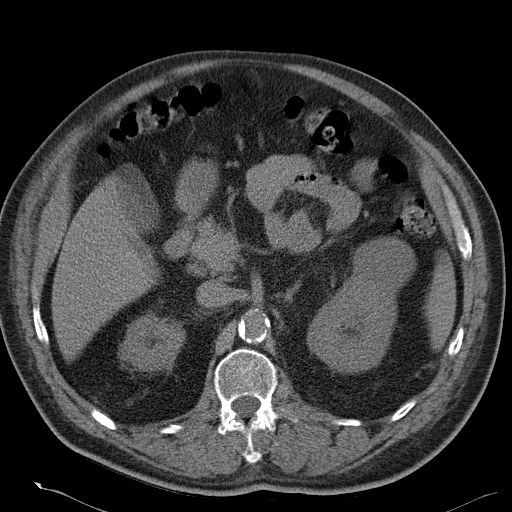
[im 76/95  soft-tissue]
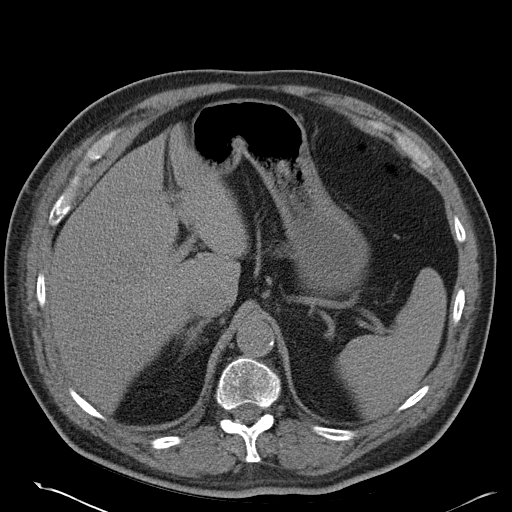
[im 82/95  soft-tissue]
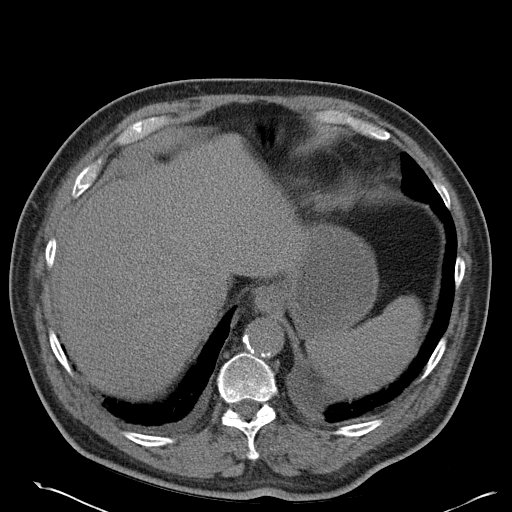
[im 88/95  soft-tissue]
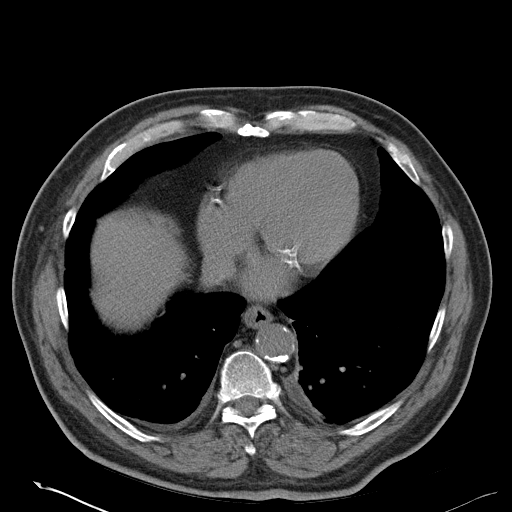

[Series 4: mpr coronal · coronal · 0.75mm/px · 3 of 111 slices shown]
[im 37/111  soft-tissue]
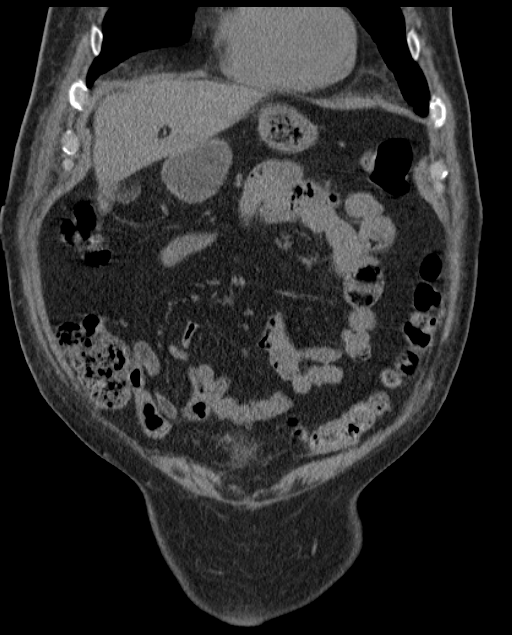
[im 49/111  soft-tissue]
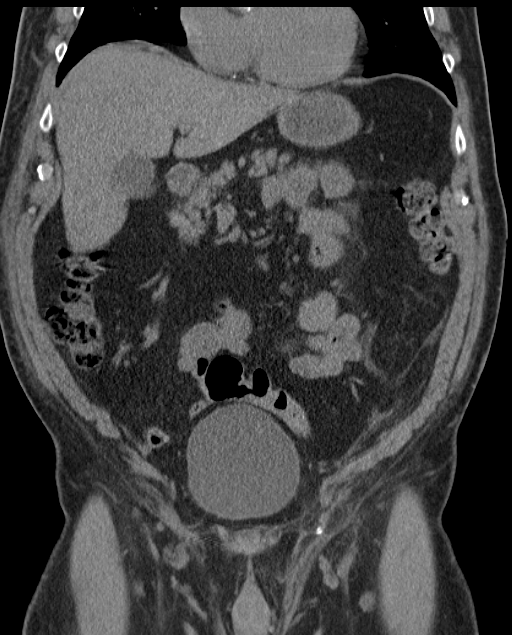
[im 62/111  soft-tissue]
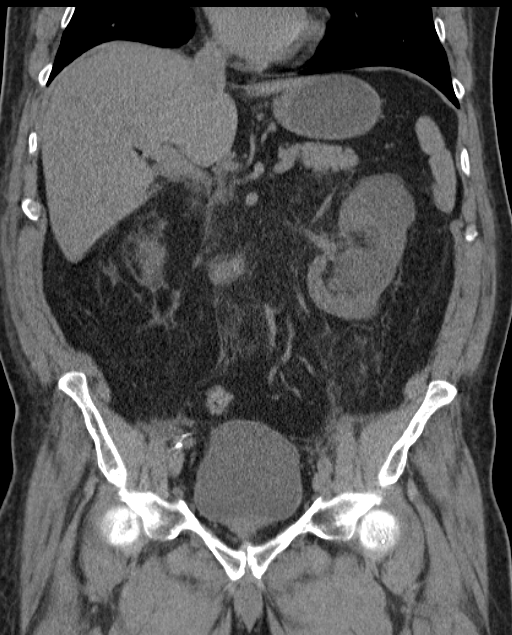

[16 of 46 positions shown; findings below may reference images not displayed]

FINDINGS: There are tiny bilateral pleural effusions.

There is an irregular 15 mm stone obstructing the left renal
pelvis.  There is secondary left hydronephrosis.  The distal ureter
is normal.  There is perinephric soft tissue stranding.  The
patient has multiple cysts in the left kidney as demonstrated on
prior ultrasound.

There is a complex left lower pole 4.3 cm cyst with irregular
linear areas of high density which could represent hemorrhage.
This cyst appeared simple on ultrasound but was not well seen.

There are calcifications adjacent to the cystic duct but these do
not appear to be within the cystic duct and probably represent
vascular calcifications.

The liver, spleen, pancreas, and adrenal glands are normal.  There
are cysts on the right kidney with a 1 mm calcification in the mid
right kidney.

The bowel appears normal.  No acute osseous abnormality.  Old
compression fracture of the superior endplate of T12.  Degenerative
disc disease at L4-5 and L5 S1.

Extensive calcification of the abdominal aorta and iliac arteries.
IMPRESSION: 1.  15 mm stone obstructing the left ureteral pelvic junction. I
suspect this may be secondary to chronic infection.
2.  Complex cystic lesion on the lower pole of the left kidney,
probably representing a hemorrhagic cyst.  Because of the patient's
impaired renal function, the best method for further assessment
would be MRI of the kidneys without contrast.

## 2012-10-14 ENCOUNTER — Other Ambulatory Visit: Payer: Self-pay

## 2012-10-14 MED ORDER — METOPROLOL SUCCINATE ER 100 MG PO TB24: 100.0000 mg | ORAL_TABLET | Freq: Every day | ORAL | Status: DC

## 2012-10-14 MED ORDER — CLONIDINE HCL 0.3 MG PO TABS: 0.3000 mg | ORAL_TABLET | Freq: Two times a day (BID) | ORAL | Status: DC

## 2012-10-14 MED ORDER — AMLODIPINE BESYLATE 10 MG PO TABS: 10.0000 mg | ORAL_TABLET | Freq: Every day | ORAL | Status: DC

## 2013-02-18 ENCOUNTER — Telehealth: Payer: Self-pay | Admitting: Family Medicine

## 2013-02-18 DIAGNOSIS — E785 Hyperlipidemia, unspecified: Secondary | ICD-10-CM

## 2013-02-18 DIAGNOSIS — I10 Essential (primary) hypertension: Secondary | ICD-10-CM

## 2013-02-19 NOTE — Telephone Encounter (Signed)
Called and notified patient that blood work is needed.  Orders place and req faxed to [facility].

## 2013-02-19 NOTE — Telephone Encounter (Signed)
pls order cmp on pt, he has both hTN and hyperlipidemia, advise him to get before his visit so I will be able to review at visit, non fast is oK

## 2013-02-25 ENCOUNTER — Ambulatory Visit: Payer: Medicare Other | Admitting: Family Medicine

## 2013-04-08 ENCOUNTER — Ambulatory Visit (HOSPITAL_COMMUNITY)
Admission: RE | Admit: 2013-04-08 | Discharge: 2013-04-08 | Disposition: A | Discharge status: Home / Self Care | Payer: Medicare Other | Source: Ambulatory Visit | Attending: Family Medicine | Admitting: Family Medicine

## 2013-04-08 ENCOUNTER — Encounter: Payer: Self-pay | Admitting: Family Medicine

## 2013-04-08 ENCOUNTER — Ambulatory Visit (INDEPENDENT_AMBULATORY_CARE_PROVIDER_SITE_OTHER): Payer: Medicare Other | Admitting: Family Medicine

## 2013-04-08 VITALS — BP 122/76 | HR 62 | Resp 16 | Ht 69.0 in | Wt 207.1 lb

## 2013-04-08 DIAGNOSIS — M7989 Other specified soft tissue disorders: Secondary | ICD-10-CM

## 2013-04-08 DIAGNOSIS — N179 Acute kidney failure, unspecified: Secondary | ICD-10-CM

## 2013-04-08 DIAGNOSIS — E785 Hyperlipidemia, unspecified: Secondary | ICD-10-CM

## 2013-04-08 DIAGNOSIS — Z125 Encounter for screening for malignant neoplasm of prostate: Secondary | ICD-10-CM

## 2013-04-08 DIAGNOSIS — I10 Essential (primary) hypertension: Secondary | ICD-10-CM

## 2013-04-08 MED ORDER — SIMVASTATIN 20 MG PO TABS: 20.0000 mg | ORAL_TABLET | Freq: Every day | ORAL | Status: DC

## 2013-04-08 MED ORDER — METOPROLOL SUCCINATE ER 100 MG PO TB24: 100.0000 mg | ORAL_TABLET | Freq: Every day | ORAL | Status: DC

## 2013-04-08 MED ORDER — CLONIDINE HCL 0.3 MG PO TABS: 0.3000 mg | ORAL_TABLET | Freq: Two times a day (BID) | ORAL | Status: DC

## 2013-04-08 MED ORDER — GEMFIBROZIL 600 MG PO TABS: 600.0000 mg | ORAL_TABLET | Freq: Two times a day (BID) | ORAL | Status: DC

## 2013-04-08 MED ORDER — AMLODIPINE BESYLATE 10 MG PO TABS: 10.0000 mg | ORAL_TABLET | Freq: Every day | ORAL | Status: DC

## 2013-04-08 NOTE — Patient Instructions (Addendum)
F/u in 4 month, call if you need me before  You need an ultrasound of the left leg to ensure no clot iin the leg today   Next week, July 9 or after , please get PSa and chem 7 non fasting  You will be referred to wound center  In [facility] fior help with left leg swelling if there is no clot in the leg, next week

## 2013-04-11 NOTE — Progress Notes (Signed)
  Subjective:    Patient ID: Randy Garrett, male    DOB: 05/29/1941, 72 y.o.   MRN: [id number]  HPI Pt in for follow up, was recently hospitalized at [facility] for ARF due to nephrolithiasis, he subsequently had extraction of stones per urethra and is doing well. Denies poor urinary stream, flank pain, fever or chills. Reports good apetite, regular bowel movements and has chronic limitation in mobility , though he walks unassisted, due to arthritis. C/o left leg swelling for several weeks, denies excessive shortness of breath or hemoptosis   Review of Systems See HPI Denies recent fever or chills. Denies sinus pressure, nasal congestion, ear pain or sore throat. Denies chest congestion, productive cough or wheezing. Denies chest pains, palpitations and leg swelling Denies abdominal pain, nausea, vomiting,diarrhea or constipation.   Denies dysuria, frequency, hesitancy or incontinence. Denies headaches, seizures, numbness, or tingling. Denies depression, anxiety or insomnia. Denies skin break down or rash.        Objective:   Physical Exam Patient alert and oriented and in no cardiopulmonary distress.  HEENT: No facial asymmetry, EOMI, no sinus tenderness,  oropharynx pink and moist.  Neck supple no adenopathy.  Chest: Clear to auscultation bilaterally.Decreased though adequate air entry throughout  CVS: S1, S2 no murmurs, no S3.  ABD: Soft non tender. Bowel sounds normal.  Ext: No edema  MS: decreased ROM spine, shoulders, hips and knees.  Skin: Intact, no ulcerations or rash noted.  Psych: Good eye contact, normal affect. Memory intact not anxious or depressed appearing.  CNS: CN 2-12 intact,        Assessment & Plan:

## 2013-04-11 NOTE — Assessment & Plan Note (Signed)
Controlled, no change in medication DASH diet and commitment to daily physical activity for a minimum of 30 minutes discussed and encouraged, as a part of hypertension management. The importance of attaining a healthy weight is also discussed.  

## 2013-04-11 NOTE — Assessment & Plan Note (Signed)
Hyperlipidemia:Low fat diet discussed and encouraged.  Will need updated lab in the near future

## 2013-04-11 NOTE — Assessment & Plan Note (Signed)
H/o ulcer to leg which appeared spontaneously, will r/o DVT and refer to wound center possiblyfor furhter evaluation and management

## 2013-04-11 NOTE — Assessment & Plan Note (Signed)
Recent recurrence due to nephrolithiasis, for which he was hospitalized at [facility], updated lab to be obtained

## 2013-04-12 ENCOUNTER — Telehealth: Payer: Self-pay

## 2013-04-12 DIAGNOSIS — E785 Hyperlipidemia, unspecified: Secondary | ICD-10-CM

## 2013-04-12 NOTE — Telephone Encounter (Signed)
Lab order for pt to be sent

## 2013-04-15 LAB — HEPATIC FUNCTION PANEL
ALT: 13 U/L (ref 0–53)
AST: 17 U/L (ref 0–37)
Albumin: 3.7 g/dL (ref 3.5–5.2)
Alkaline Phosphatase: 94 U/L (ref 39–117)
Bilirubin, Direct: 0.1 mg/dL (ref 0.0–0.3)
Indirect Bilirubin: 0.4 mg/dL (ref 0.0–0.9)
Total Bilirubin: 0.5 mg/dL (ref 0.3–1.2)
Total Protein: 6.7 g/dL (ref 6.0–8.3)

## 2013-04-15 LAB — LIPID PANEL
Cholesterol: 142 mg/dL (ref 0–200)
HDL: 32 mg/dL — ABNORMAL LOW
LDL Cholesterol: 99 mg/dL (ref 0–99)
Total CHOL/HDL Ratio: 4.4 ratio
Triglycerides: 53 mg/dL
VLDL: 11 mg/dL (ref 0–40)

## 2013-04-15 LAB — BASIC METABOLIC PANEL
BUN: 20 mg/dL (ref 6–23)
Calcium: 9.2 mg/dL (ref 8.4–10.5)
Glucose, Bld: 111 mg/dL — ABNORMAL HIGH (ref 70–99)

## 2013-04-15 LAB — PSA, MEDICARE: PSA: 2.58 ng/mL

## 2013-04-29 ENCOUNTER — Telehealth: Payer: Self-pay | Admitting: Family Medicine

## 2013-04-29 NOTE — Telephone Encounter (Signed)
Spoke with patient regarding his lab results

## 2013-06-30 ENCOUNTER — Ambulatory Visit (INDEPENDENT_AMBULATORY_CARE_PROVIDER_SITE_OTHER): Payer: Medicare Other

## 2013-06-30 DIAGNOSIS — Z23 Encounter for immunization: Secondary | ICD-10-CM

## 2013-07-01 ENCOUNTER — Other Ambulatory Visit: Payer: Self-pay

## 2013-07-01 ENCOUNTER — Telehealth (INDEPENDENT_AMBULATORY_CARE_PROVIDER_SITE_OTHER): Payer: Medicare Other | Admitting: Family Medicine

## 2013-07-01 DIAGNOSIS — N390 Urinary tract infection, site not specified: Secondary | ICD-10-CM

## 2013-07-01 LAB — POCT URINALYSIS DIPSTICK
Ketones, UA: NEGATIVE
Spec Grav, UA: 1.025
pH, UA: 6.5

## 2013-07-01 MED ORDER — CIPROFLOXACIN HCL 500 MG PO TABS: 500.0000 mg | ORAL_TABLET | Freq: Two times a day (BID) | ORAL | Status: AC

## 2013-07-01 NOTE — Telephone Encounter (Signed)
Wants med sent to [facility] in [place]

## 2013-07-01 NOTE — Telephone Encounter (Signed)
Patients wife came to collect a cup and brought it back to be tested in office. Urine is infected so will send for culture

## 2013-07-01 NOTE — Telephone Encounter (Signed)
Started Sunday, chills and elevated temp, low grade 99.2 to 100.1. Horrible burning on urination. His spouse is calling and he is determined he does not want to come to the Dr but is in pain. She is scared it will go to his kidneys. I told her if no OV then definitely would have to get a urine specimen for testing before anything is prescribed. She understands. Told her I would discuss with you and call her right back.   [phone]

## 2013-07-01 NOTE — Telephone Encounter (Signed)
ua in office and specimen for c/s asap

## 2013-07-01 NOTE — Telephone Encounter (Signed)
Called and left message.

## 2013-07-01 NOTE — Telephone Encounter (Signed)
Will bring him to the office today

## 2013-07-01 NOTE — Telephone Encounter (Signed)
pls send cipro 500mg  one twice daily for 10 days also for c/s If he worsens, based on symptoms priovided may need to go to ED  I strongly suggest based on symptom severity he get cbc and diff and chem 7 , since he does appear to have a UTI, order if he agrees to get the lab  Encourage increased water intake minimum 64 ounces daily

## 2013-07-02 ENCOUNTER — Other Ambulatory Visit: Payer: Self-pay

## 2013-07-02 ENCOUNTER — Other Ambulatory Visit: Payer: Self-pay | Admitting: Family Medicine

## 2013-07-02 DIAGNOSIS — N390 Urinary tract infection, site not specified: Secondary | ICD-10-CM

## 2013-07-02 LAB — CBC WITH DIFFERENTIAL/PLATELET
Basophils Relative: 0 % (ref 0–1)
HCT: 38.8 % — ABNORMAL LOW (ref 39.0–52.0)
Hemoglobin: 14 g/dL (ref 13.0–17.0)
Lymphs Abs: 2 10*3/uL (ref 0.7–4.0)
Monocytes Relative: 11 % (ref 3–12)
Neutro Abs: 6.4 10*3/uL (ref 1.7–7.7)
Neutrophils Relative %: 67 % (ref 43–77)
RBC: 4.48 MIL/uL (ref 4.22–5.81)
RDW: 13 % (ref 11.5–15.5)
WBC: 9.7 10*3/uL (ref 4.0–10.5)

## 2013-07-02 LAB — BASIC METABOLIC PANEL
CO2: 30 mEq/L (ref 19–32)
Creat: 1.14 mg/dL (ref 0.50–1.35)
Glucose, Bld: 94 mg/dL (ref 70–99)
Potassium: 3.8 mEq/L (ref 3.5–5.3)
Sodium: 139 mEq/L (ref 135–145)

## 2013-07-02 NOTE — Telephone Encounter (Signed)
Spoke with patient. He is aware to do bloodwork and has already collected the med

## 2013-07-06 LAB — URINE CULTURE

## 2013-08-11 ENCOUNTER — Ambulatory Visit (INDEPENDENT_AMBULATORY_CARE_PROVIDER_SITE_OTHER): Payer: Medicare Other | Admitting: Family Medicine

## 2013-08-11 ENCOUNTER — Encounter (INDEPENDENT_AMBULATORY_CARE_PROVIDER_SITE_OTHER): Payer: Self-pay

## 2013-08-11 ENCOUNTER — Encounter: Payer: Self-pay | Admitting: Family Medicine

## 2013-08-11 VITALS — BP 138/70 | HR 56 | Resp 16 | Ht 69.0 in | Wt 222.0 lb

## 2013-08-11 DIAGNOSIS — E785 Hyperlipidemia, unspecified: Secondary | ICD-10-CM

## 2013-08-11 DIAGNOSIS — E669 Obesity, unspecified: Secondary | ICD-10-CM

## 2013-08-11 DIAGNOSIS — Q619 Cystic kidney disease, unspecified: Secondary | ICD-10-CM

## 2013-08-11 DIAGNOSIS — E049 Nontoxic goiter, unspecified: Secondary | ICD-10-CM

## 2013-08-11 DIAGNOSIS — I10 Essential (primary) hypertension: Secondary | ICD-10-CM

## 2013-08-11 DIAGNOSIS — Z1211 Encounter for screening for malignant neoplasm of colon: Secondary | ICD-10-CM

## 2013-08-11 DIAGNOSIS — N281 Cyst of kidney, acquired: Secondary | ICD-10-CM

## 2013-08-11 DIAGNOSIS — N390 Urinary tract infection, site not specified: Secondary | ICD-10-CM

## 2013-08-11 DIAGNOSIS — Z125 Encounter for screening for malignant neoplasm of prostate: Secondary | ICD-10-CM

## 2013-08-11 LAB — POC HEMOCCULT BLD/STL (OFFICE/1-CARD/DIAGNOSTIC): Fecal Occult Blood, POC: NEGATIVE

## 2013-08-11 MED ORDER — AMLODIPINE BESYLATE 10 MG PO TABS: 10.0000 mg | ORAL_TABLET | Freq: Every day | ORAL | Status: DC

## 2013-08-11 NOTE — Patient Instructions (Signed)
F/u in 5.5 month, call if you need me before  Rectal exam today  Fasting lipid and cmp and TSH in 5.5 month, before OV  If you start having symptoms of UTI , you need to call in to be seen , or for urine testing, depending on how sick you feel  Continue meds as before  Your pink MOST form is the official form for you to keep at home

## 2013-08-11 NOTE — Progress Notes (Signed)
  Subjective:    Patient ID: Randy Garrett, male    DOB: [birth year], 72 y.o.   MRN: [id number]  HPI The PT is here for follow up and re-evaluation of chronic medical conditions, medication management and review of any available recent lab and radiology data.  Preventive health is updated, specifically  Cancer screening and Immunization.   Questions or concerns regarding consultations or procedures which the PT has had in the interim are  Addressed.Recently seen in the urology clinic at [facility] where he is followed, and has a 6 month follow up Has brought in a copy of his MOST form , which he reviewed with his daughter , this is reviewed again with him at this visit, and the pink form completed and given to him. Denies stinging or burning at the penis, advised him  To be careful in case he starts developing UTI symptoms as increased risk due to past history and multiple renal cysts The PT denies any adverse reactions to current medications since the last visit.  Has gained a significant amt of weight since last visit, needs to worl on weight loss    Review of Systems See HPI Denies recent fever or chills. Denies sinus pressure, nasal congestion, ear pain or sore throat. Denies chest congestion, productive cough or wheezing. Denies chest pains, palpitations and leg swelling Denies abdominal pain, nausea, vomiting,diarrhea or constipation.   Denies dysuria, frequency, hesitancy or incontinence. 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 Denies headaches Denies depression, anxiety or insomnia. Denies skin break down or rash.        Objective:   Physical Exam  Patient alert and oriented and in no cardiopulmonary distress.  HEENT: No facial asymmetry, EOMI, no sinus tenderness,  oropharynx pink and moist.  Neck decreased though adequate ROM, no adenopathy.  Chest: Clear to auscultation bilaterally.  CVS: S1, S2 no murmurs, no  S3.  ABD: Soft non tender. Bowel sounds normal. Rectal: no mass, heme negative stool. Prostate firm and smooth Ext: No edema  XL:[id number] ROM spine, shoulders, hips and knees.  Skin: Intact, no ulcerations or rash noted.  Psych: Good eye contact, normal affect. Memory intact not anxious or depressed appearing.  CNS: CN 2-12 intact, .       Assessment & Plan:

## 2013-08-12 DIAGNOSIS — N281 Cyst of kidney, acquired: Secondary | ICD-10-CM | POA: Insufficient documentation

## 2013-08-12 DIAGNOSIS — N390 Urinary tract infection, site not specified: Secondary | ICD-10-CM | POA: Insufficient documentation

## 2013-08-12 NOTE — Assessment & Plan Note (Signed)
Controlled, no change in medication Hyperlipidemia:Low fat diet discussed and encouraged.  Updated lab next visit 

## 2013-08-12 NOTE — Assessment & Plan Note (Signed)
Currently asymptomatic, high risk for recurrent UTI. Educated re the need to call/ come in to be seen , as soon as he becomes symptoamtic

## 2013-08-12 NOTE — Assessment & Plan Note (Signed)
Deteriorated. Patient re-educated about  the importance of commitment to a  minimum of 150 minutes of exercise per week. The importance of healthy food choices with portion control discussed. Encouraged to start a food diary, count calories and to consider  joining a support group. Sample diet sheets offered. Goals set by the patient for the next several months.    

## 2013-08-12 NOTE — Assessment & Plan Note (Signed)
Controlled, no change in medication DASH diet and commitment to daily physical activity for a minimum of 30 minutes discussed and encouraged, as a part of hypertension management. The importance of attaining a healthy weight is also discussed.  

## 2013-09-08 ENCOUNTER — Encounter: Payer: Self-pay | Admitting: Family Medicine

## 2013-09-30 IMAGING — US US EXTREM LOW VENOUS*L*
1 series · 14 of 24 positions shown · non-contrast
Comparison: None.

CLINICAL DATA: History of swelling of the left leg.

LEFT LOWER EXTREMITY VENOUS DUPLEX ULTRASOUND
TECHNIQUE: Gray-scale sonography with graded compression, as well
as color Doppler and duplex ultrasound were performed to evaluate
the deep venous system of the lower extremity from the level of the
common femoral vein through the popliteal and proximal calf veins.
Spectral Doppler was utilized to evaluate flow at rest and with
distal augmentation maneuvers.

[Series 1: us extrem low venous*left* · 0.09mm/px · 14 of 32 slices shown]
[im 1/32]
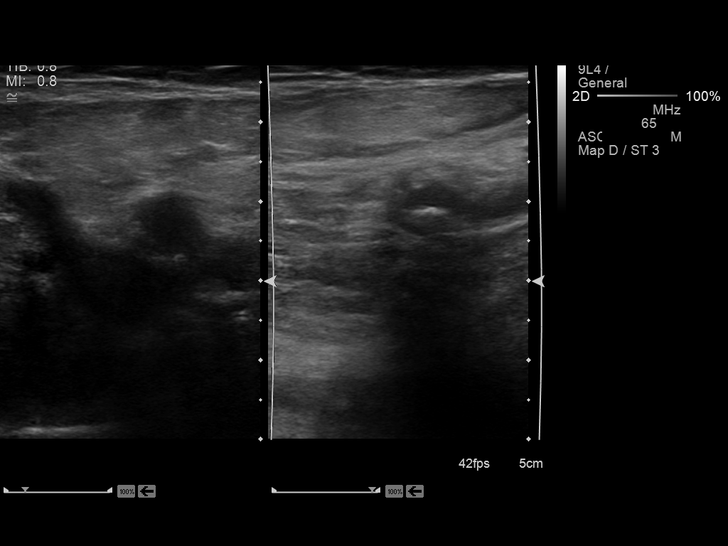
[im 3/32]
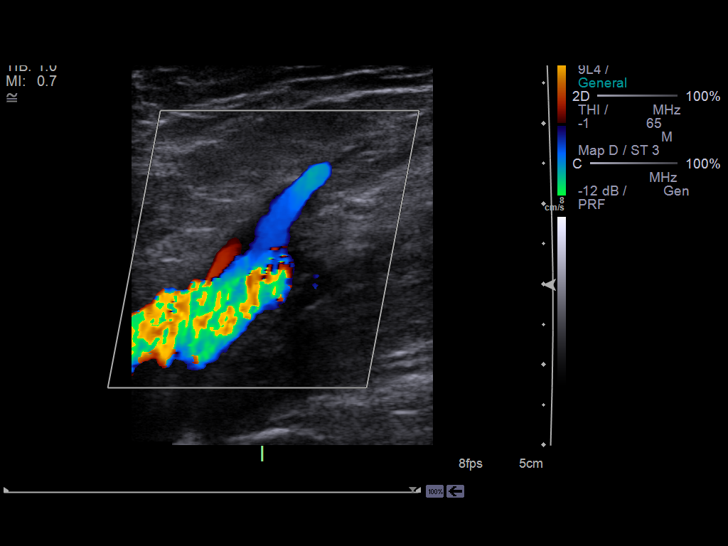
[im 6/32]
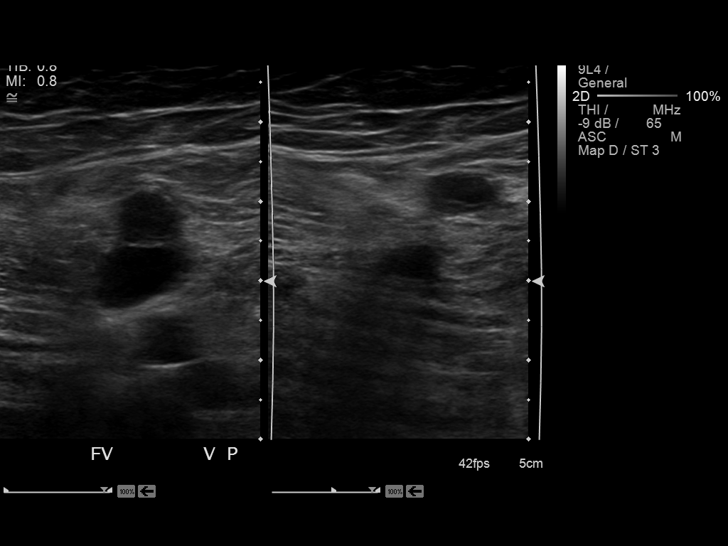
[im 9/32]
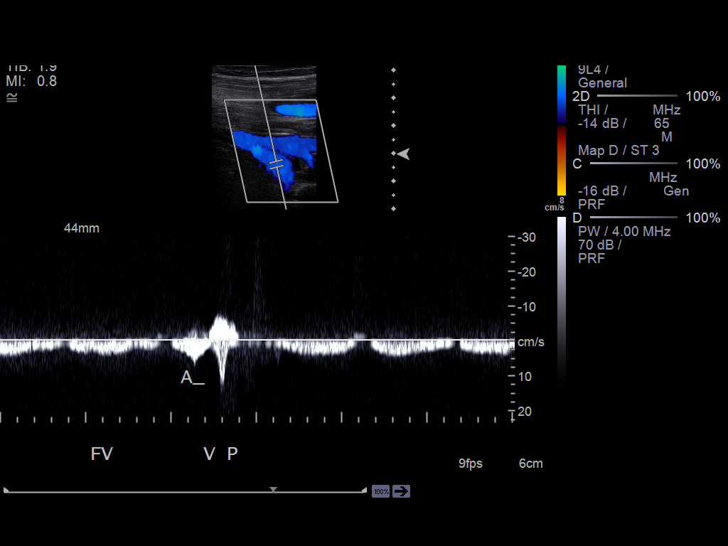
[im 10/32]
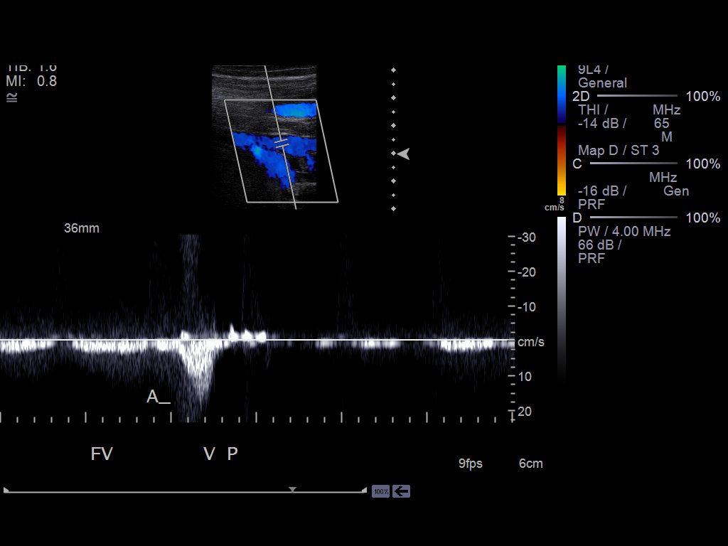
[im 13/32]
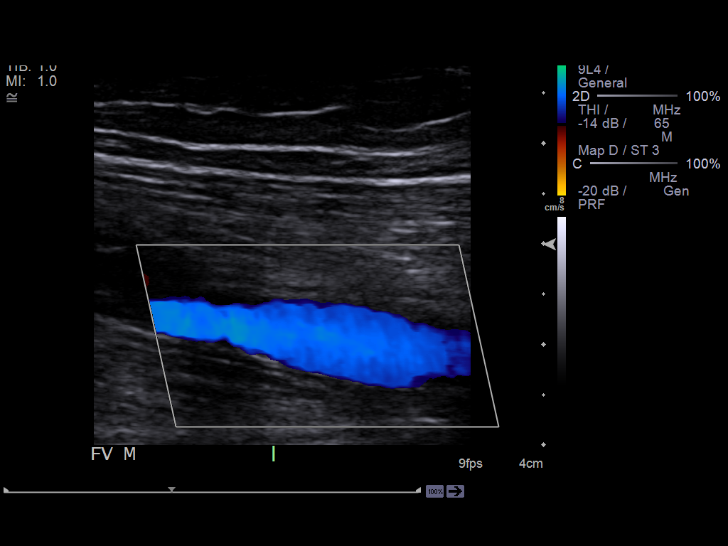
[im 15/32]
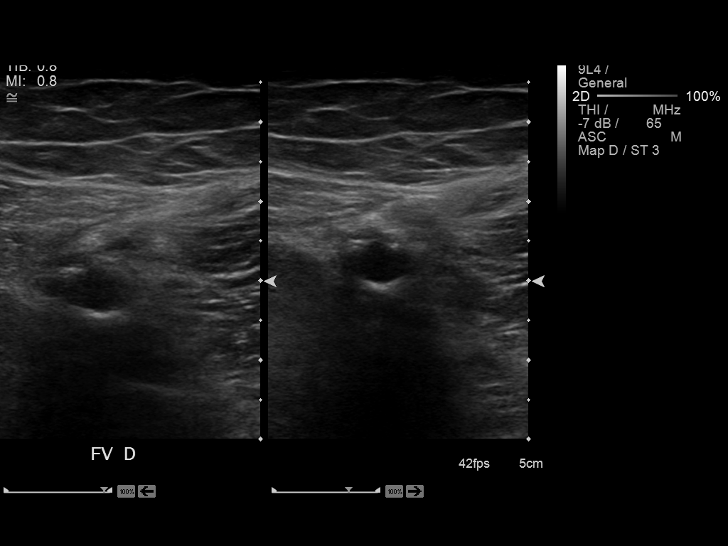
[im 17/32]
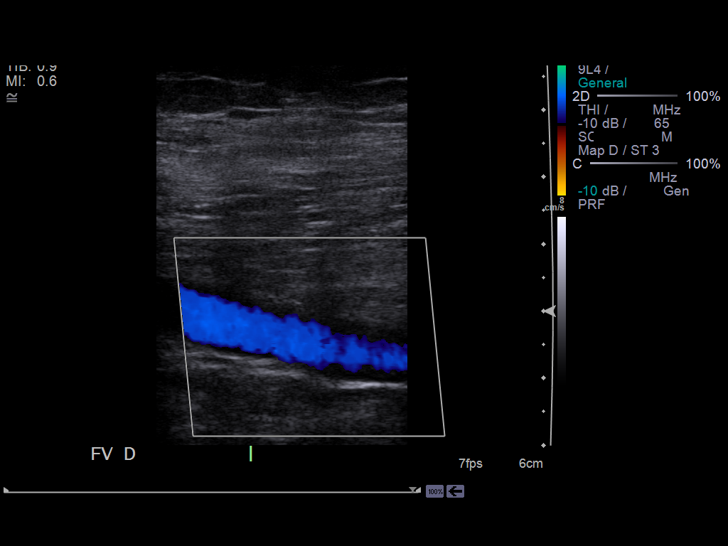
[im 19/32]
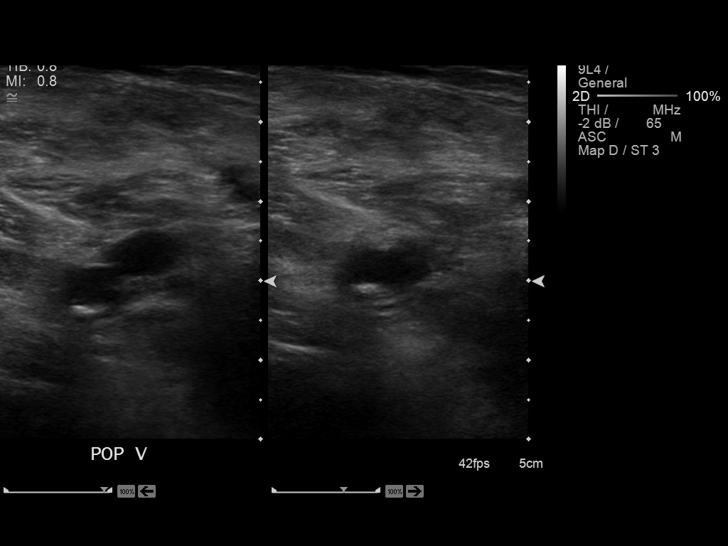
[im 22/32]
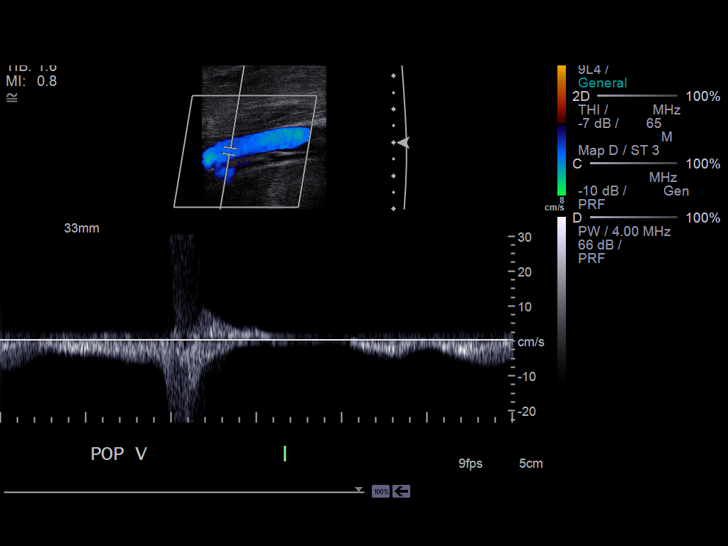
[im 25/32]
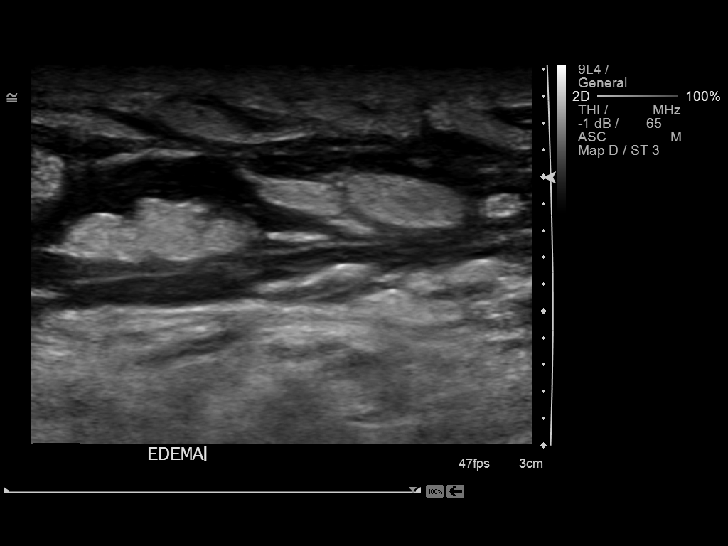
[im 26/32]
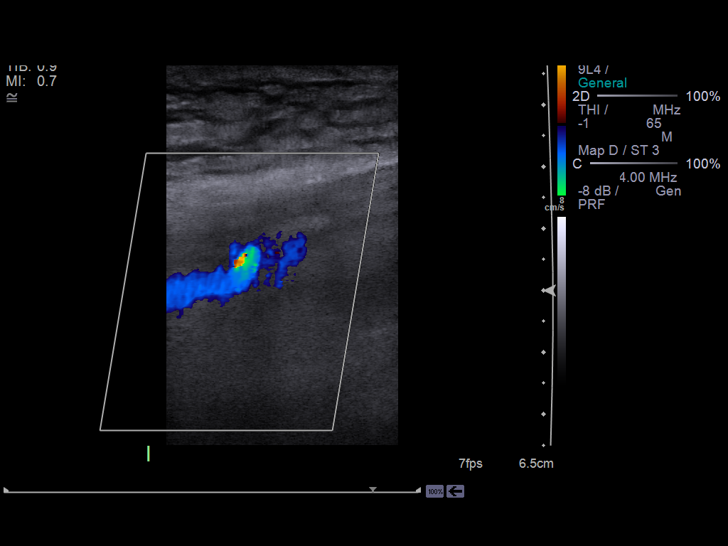
[im 29/32]
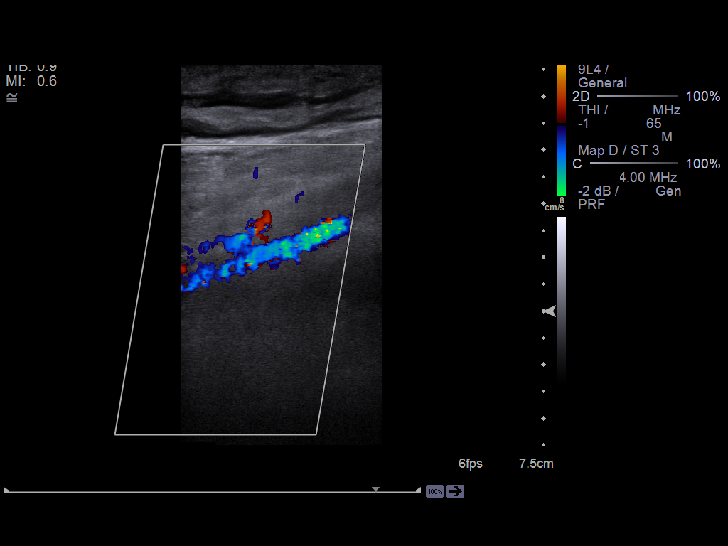
[im 32/32]
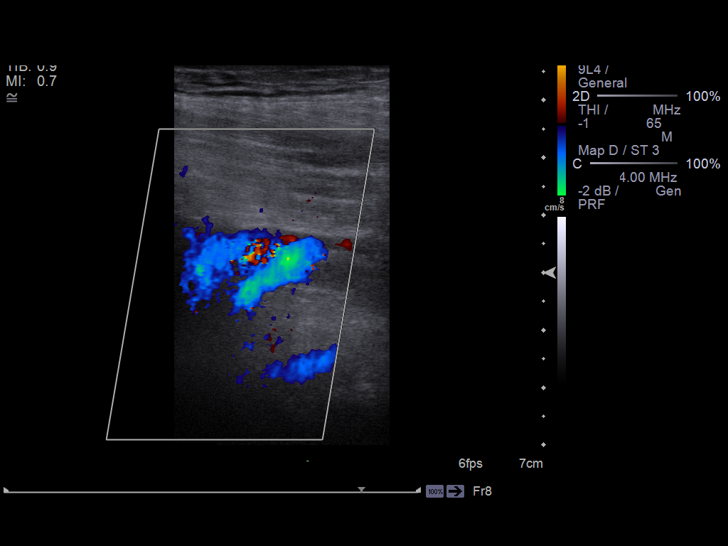

[14 of 24 positions shown; findings below may reference images not displayed]

FINDINGS: Normal compressibility of the common femoral,
superficial femoral, and popliteal veins is demonstrated, as well
as the visualized proximal calf veins.  No filling defects to
suggest DVT on grayscale or color Doppler imaging.  Doppler
waveforms show normal direction of venous flow, normal respiratory
phasicity and response to augmentation. There is some calf edema.
IMPRESSION: No evidence of left leg deep venous thrombosis.  There is some calf
edema.

## 2013-10-12 ENCOUNTER — Encounter (INDEPENDENT_AMBULATORY_CARE_PROVIDER_SITE_OTHER): Payer: Self-pay

## 2013-10-12 ENCOUNTER — Ambulatory Visit (INDEPENDENT_AMBULATORY_CARE_PROVIDER_SITE_OTHER): Payer: Medicare HMO | Admitting: Family Medicine

## 2013-10-12 ENCOUNTER — Encounter: Payer: Self-pay | Admitting: Family Medicine

## 2013-10-12 ENCOUNTER — Other Ambulatory Visit: Payer: Self-pay | Admitting: Family Medicine

## 2013-10-12 VITALS — BP 180/80 | HR 68 | Resp 18 | Ht 69.0 in | Wt 233.0 lb

## 2013-10-12 DIAGNOSIS — N289 Disorder of kidney and ureter, unspecified: Secondary | ICD-10-CM

## 2013-10-12 DIAGNOSIS — N2889 Other specified disorders of kidney and ureter: Secondary | ICD-10-CM

## 2013-10-12 DIAGNOSIS — L03116 Cellulitis of left lower limb: Secondary | ICD-10-CM

## 2013-10-12 DIAGNOSIS — M7989 Other specified soft tissue disorders: Secondary | ICD-10-CM

## 2013-10-12 DIAGNOSIS — I10 Essential (primary) hypertension: Secondary | ICD-10-CM

## 2013-10-12 DIAGNOSIS — L03119 Cellulitis of unspecified part of limb: Secondary | ICD-10-CM

## 2013-10-12 DIAGNOSIS — L02419 Cutaneous abscess of limb, unspecified: Secondary | ICD-10-CM

## 2013-10-12 LAB — CBC WITH DIFFERENTIAL/PLATELET
Basophils Absolute: 0.1 10*3/uL (ref 0.0–0.1)
Basophils Relative: 1 % (ref 0–1)
Eosinophils Absolute: 0.3 10*3/uL (ref 0.0–0.7)
Eosinophils Relative: 4 % (ref 0–5)
HEMATOCRIT: 44.9 % (ref 39.0–52.0)
Hemoglobin: 15.7 g/dL (ref 13.0–17.0)
LYMPHS PCT: 29 % (ref 12–46)
Lymphs Abs: 2.3 10*3/uL (ref 0.7–4.0)
MCH: 32.1 pg (ref 26.0–34.0)
MCHC: 35 g/dL (ref 30.0–36.0)
MCV: 91.8 fL (ref 78.0–100.0)
Monocytes Absolute: 1.1 10*3/uL — ABNORMAL HIGH (ref 0.1–1.0)
Monocytes Relative: 14 % — ABNORMAL HIGH (ref 3–12)
NEUTROS ABS: 4 10*3/uL (ref 1.7–7.7)
NEUTROS PCT: 52 % (ref 43–77)
PLATELETS: 262 10*3/uL (ref 150–400)
RBC: 4.89 MIL/uL (ref 4.22–5.81)
RDW: 13.6 % (ref 11.5–15.5)
WBC: 7.8 10*3/uL (ref 4.0–10.5)

## 2013-10-12 MED ORDER — CEFTRIAXONE SODIUM 1 G IJ SOLR
500.0000 mg | Freq: Once | INTRAMUSCULAR | Status: AC
  Administered 2013-10-12: 500 mg via INTRAMUSCULAR

## 2013-10-12 MED ORDER — DOXYCYCLINE HYCLATE 100 MG PO TBEC: 100.0000 mg | DELAYED_RELEASE_TABLET | Freq: Two times a day (BID) | ORAL | Status: DC

## 2013-10-12 MED ORDER — TRIAMTERENE-HCTZ 37.5-25 MG PO TABS: 1.0000 | ORAL_TABLET | Freq: Every day | ORAL | Status: DC

## 2013-10-12 NOTE — Progress Notes (Signed)
   Subjective:    Patient ID: Randy Garrett, male    DOB: [birth year], 73 y.o.   MRN: [id number]  HPI 2 week h/o increased leg swelling with weeping of the skin and redness. Denies fever or chills. States did not seek sooner attention due to lack of money. No h/o direct trauma to legs, has had problem like this in the past 18 months Denies chest pain, palpitations , PND or orthopnea, no change noted in urinary output   Review of Systems See HPI Denies recent fever or chills. Denies sinus pressure, nasal congestion, ear pain or sore throat. Denies chest congestion, productive cough or wheezing. Denies chest pains, palpitations and leg swelling Denies abdominal pain, nausea, vomiting,diarrhea or constipation.   Denies dysuria, frequency, hesitancy or incontinence. .   Chronic joint stiffness with limitation in mobility     Objective:   Physical Exam  Patient alert and oriented and in no cardiopulmonary distress.  HEENT: No facial asymmetry, EOMI, no sinus tenderness,  oropharynx pink and moist.  Neck supple no adenopathy.  Chest: Clear to auscultation bilaterally.  CVS: S1, S2 systolic murmur, no S3.  ABD: Soft non tender. Bowel sounds normal.  Ext: bilateral leg edema  MS:  Though reduced ROM spine, shoulders, hips and knees.  Skin: Erythema of both legs with weeping from broken cracked skin from severe edema    CNS: CN 2-12 intact, power,  normal throughout.       Assessment & Plan:

## 2013-10-12 NOTE — Patient Instructions (Addendum)
F/u in 3 weeks, call if worsening.  Keep legs elevated , and reduce salt intake.  Your blood pressure is high and you have cellulitis, 2 new medications are prescribed  CBc and diff and chem 7 today

## 2013-10-13 ENCOUNTER — Other Ambulatory Visit: Payer: Self-pay

## 2013-10-13 DIAGNOSIS — I10 Essential (primary) hypertension: Secondary | ICD-10-CM

## 2013-10-13 DIAGNOSIS — L03116 Cellulitis of left lower limb: Secondary | ICD-10-CM

## 2013-10-13 LAB — BASIC METABOLIC PANEL
BUN: 21 mg/dL (ref 6–23)
CHLORIDE: 110 meq/L (ref 96–112)
CO2: 26 mEq/L (ref 19–32)
Calcium: 8 mg/dL — ABNORMAL LOW (ref 8.4–10.5)
Creat: 1.37 mg/dL — ABNORMAL HIGH (ref 0.50–1.35)
Glucose, Bld: 77 mg/dL (ref 70–99)
POTASSIUM: 4.2 meq/L (ref 3.5–5.3)
Sodium: 145 mEq/L (ref 135–145)

## 2013-10-13 MED ORDER — DOXYCYCLINE HYCLATE 100 MG PO TBEC: 100.0000 mg | DELAYED_RELEASE_TABLET | Freq: Two times a day (BID) | ORAL | Status: AC

## 2013-10-13 MED ORDER — TRIAMTERENE-HCTZ 37.5-25 MG PO TABS: 1.0000 | ORAL_TABLET | Freq: Every day | ORAL | Status: DC

## 2013-10-18 NOTE — Assessment & Plan Note (Signed)
Leg elevation ,and with diuretic in BP med , as well as treatment of cellulitis , hopefully swelling will improve

## 2013-10-18 NOTE — Assessment & Plan Note (Signed)
Antibiotic course prescribed ,a nd pt and partner educated re need to keep skin clean and dry and protected

## 2013-10-18 NOTE — Assessment & Plan Note (Signed)
Uncontrolled, add maxzide. Pt to reduce sodium intake

## 2013-10-25 ENCOUNTER — Other Ambulatory Visit: Payer: Self-pay

## 2013-10-25 DIAGNOSIS — I10 Essential (primary) hypertension: Secondary | ICD-10-CM

## 2013-10-25 MED ORDER — SIMVASTATIN 20 MG PO TABS: 20.0000 mg | ORAL_TABLET | Freq: Every day | ORAL | Status: DC

## 2013-10-25 MED ORDER — AMLODIPINE BESYLATE 10 MG PO TABS: 10.0000 mg | ORAL_TABLET | Freq: Every day | ORAL | Status: DC

## 2013-10-25 MED ORDER — CLONIDINE HCL 0.3 MG PO TABS: 0.3000 mg | ORAL_TABLET | Freq: Two times a day (BID) | ORAL | Status: DC

## 2013-10-25 MED ORDER — GEMFIBROZIL 600 MG PO TABS: 600.0000 mg | ORAL_TABLET | Freq: Two times a day (BID) | ORAL | Status: DC

## 2013-10-25 MED ORDER — TRIAMTERENE-HCTZ 37.5-25 MG PO TABS: 1.0000 | ORAL_TABLET | Freq: Every day | ORAL | Status: DC

## 2013-10-25 MED ORDER — METOPROLOL SUCCINATE ER 100 MG PO TB24: 100.0000 mg | ORAL_TABLET | Freq: Every day | ORAL | Status: DC

## 2013-11-02 ENCOUNTER — Encounter: Payer: Self-pay | Admitting: Family Medicine

## 2013-11-02 ENCOUNTER — Ambulatory Visit (INDEPENDENT_AMBULATORY_CARE_PROVIDER_SITE_OTHER): Payer: Medicare HMO | Admitting: Family Medicine

## 2013-11-02 VITALS — BP 150/76 | HR 54 | Resp 16 | Wt 223.8 lb

## 2013-11-02 DIAGNOSIS — I8310 Varicose veins of unspecified lower extremity with inflammation: Secondary | ICD-10-CM

## 2013-11-02 DIAGNOSIS — I872 Venous insufficiency (chronic) (peripheral): Secondary | ICD-10-CM | POA: Insufficient documentation

## 2013-11-02 DIAGNOSIS — I10 Essential (primary) hypertension: Secondary | ICD-10-CM

## 2013-11-02 DIAGNOSIS — E669 Obesity, unspecified: Secondary | ICD-10-CM

## 2013-11-02 DIAGNOSIS — L03119 Cellulitis of unspecified part of limb: Secondary | ICD-10-CM

## 2013-11-02 DIAGNOSIS — I739 Peripheral vascular disease, unspecified: Secondary | ICD-10-CM

## 2013-11-02 DIAGNOSIS — Z125 Encounter for screening for malignant neoplasm of prostate: Secondary | ICD-10-CM

## 2013-11-02 DIAGNOSIS — M7989 Other specified soft tissue disorders: Secondary | ICD-10-CM

## 2013-11-02 DIAGNOSIS — L02419 Cutaneous abscess of limb, unspecified: Secondary | ICD-10-CM

## 2013-11-02 DIAGNOSIS — E785 Hyperlipidemia, unspecified: Secondary | ICD-10-CM

## 2013-11-02 DIAGNOSIS — L03116 Cellulitis of left lower limb: Secondary | ICD-10-CM

## 2013-11-02 MED ORDER — SIMVASTATIN 20 MG PO TABS: 20.0000 mg | ORAL_TABLET | Freq: Every day | ORAL | Status: AC

## 2013-11-02 MED ORDER — AMLODIPINE BESYLATE 10 MG PO TABS: 10.0000 mg | ORAL_TABLET | Freq: Every day | ORAL | Status: AC

## 2013-11-02 MED ORDER — CLONIDINE HCL 0.3 MG PO TABS: 0.3000 mg | ORAL_TABLET | Freq: Two times a day (BID) | ORAL | Status: AC

## 2013-11-02 MED ORDER — GEMFIBROZIL 600 MG PO TABS: 600.0000 mg | ORAL_TABLET | Freq: Two times a day (BID) | ORAL | Status: AC

## 2013-11-02 MED ORDER — TRIAMTERENE-HCTZ 37.5-25 MG PO TABS: 1.0000 | ORAL_TABLET | Freq: Every day | ORAL | Status: DC

## 2013-11-02 MED ORDER — METOPROLOL SUCCINATE ER 100 MG PO TB24
100.0000 mg | ORAL_TABLET | Freq: Every day | ORAL | Status: AC
Start: 2013-11-02 — End: 2014-11-02

## 2013-11-02 NOTE — Patient Instructions (Signed)
Annual wellness in mid July, call if you need me before  Blood pressure much improved, continue current medication  You are referred to vascular surgeon re chronic leg swelling  Fasting lipid, cmp, PSA 7/10 or after

## 2013-11-26 ENCOUNTER — Other Ambulatory Visit: Payer: Self-pay | Admitting: *Deleted

## 2013-11-26 DIAGNOSIS — M7989 Other specified soft tissue disorders: Secondary | ICD-10-CM

## 2013-11-28 NOTE — Progress Notes (Signed)
   Subjective:    Patient ID: Randy Garrett, male    DOB: August 21, 1941, 73 y.o.   MRN: [id number]  HPI The PT is here for follow up and re-evaluation of chronic medical conditions in particular hypertension which was markedly uncontrolled at last visit , as well as chronic leg edema , which is worsening with time.  Medication management and review of any available recent lab and radiology data is also done  Preventive health is updated, specifically  Cancer screening and Immunization.   Questions or concerns regarding consultations or procedures which the PT has had in the interim are  addressed. The PT denies any adverse reactions to current medications since the last visit.  There are no new concerns.  There are no specific complaints       Review of Systems See HPI Denies recent fever or chills. Denies sinus pressure, nasal congestion, ear pain or sore throat. Denies chest congestion, productive cough or wheezing. Denies chest pains, palpitations and has chronic leg swelling , now agreeable to vascular eval which has been recommended in the past, help off before for financial reasons Denies abdominal pain, nausea, vomiting,diarrhea or constipation.   Denies dysuria, frequency,  Chronic  joint pain, and limitation in mobility. Denies headaches, seizures, numbness, or tingling. Denies depression, anxiety or insomnia. Denies skin break down or rash.        Objective:   Physical Exam BP 150/76  Pulse 54  Resp 16  Wt 223 lb 12.8 oz (101.515 kg)  SpO2 96% Patient alert and oriented and in no cardiopulmonary distress.  HEENT: No facial asymmetry, EOMI, no sinus tenderness,  oropharynx pink and moist.  Neck supple no adenopathy.  Chest: Clear to auscultation bilaterally.  CVS: S1, S2  no S3.Systolic murmur  ABD: Soft non tender. Bowel sounds normal.  Ext: bilateral lower ext edema , left greater than rigth  MS: decreased ROM spine, shoulders, hips and knees.  Skin:  Intact, erythema of lower extremities, no ulceration or drainage  Psych: Good eye contact, normal affect. Memory intact not anxious or depressed appearing.  CNS: CN 2-12 intact, power, tone and sensation normal throughout.        Assessment & Plan:  HYPERTENSION Improved , adequate control based on age, no med change DASH diet and commitment to daily physical activity for a minimum of 30 minutes discussed and encouraged, as a part of hypertension management. The importance of attaining a healthy weight is also discussed.   HYPERLIPIDEMIA Updated lab needed at/ before next visit. Hyperlipidemia:Low fat diet discussed and encouraged.    Left leg swelling Chronic left leg swelling , needs evaluation by vascular surgeon to establish  underlying pathology and to see if any intervention will be hlpful Encouraged to keep leg elevated when sitting in the interim, and to keep active to help circulation in the legs  Cellulitis of leg, left No sign of infection but leg remains red and swollen  OBESITY Improved. Likley due to aggressive diuresis between visits, needs t continue to work on weight loss, through lifestyle change  Venous stasis dermatitis of both lower extremities Needs vascular evaluation and now agrees to have this done, this has been a chronic problem but appears to be worsening esp in left leg

## 2013-11-28 NOTE — Assessment & Plan Note (Signed)
Improved , adequate control based on age, no med change DASH diet and commitment to daily physical activity for a minimum of 30 minutes discussed and encouraged, as a part of hypertension management. The importance of attaining a healthy weight is also discussed.

## 2013-11-28 NOTE — Assessment & Plan Note (Signed)
No sign of infection but leg remains red and swollen

## 2013-11-28 NOTE — Assessment & Plan Note (Signed)
Improved. Likley due to aggressive diuresis between visits, needs t continue to work on weight loss, through lifestyle change

## 2013-11-28 NOTE — Assessment & Plan Note (Signed)
Needs vascular evaluation and now agrees to have this done, this has been a chronic problem but appears to be worsening esp in left leg

## 2013-11-28 NOTE — Assessment & Plan Note (Signed)
Updated lab needed at/ before next visit. Hyperlipidemia:Low fat diet discussed and encouraged.   

## 2013-11-28 NOTE — Assessment & Plan Note (Signed)
Chronic left leg swelling , needs evaluation by vascular surgeon to establish  underlying pathology and to see if any intervention will be hlpful Encouraged to keep leg elevated when sitting in the interim, and to keep active to help circulation in the legs

## 2013-12-03 ENCOUNTER — Encounter: Payer: Self-pay | Admitting: Surgery

## 2013-12-06 ENCOUNTER — Telehealth: Payer: Self-pay | Admitting: Family Medicine

## 2013-12-06 ENCOUNTER — Ambulatory Visit (HOSPITAL_COMMUNITY)
Admission: RE | Admit: 2013-12-06 | Discharge: 2013-12-06 | Disposition: A | Discharge status: Home / Self Care | Payer: Medicare HMO | Source: Ambulatory Visit | Attending: Surgery | Admitting: Surgery

## 2013-12-06 ENCOUNTER — Ambulatory Visit (INDEPENDENT_AMBULATORY_CARE_PROVIDER_SITE_OTHER): Payer: Medicare HMO | Admitting: Surgery

## 2013-12-06 ENCOUNTER — Encounter: Payer: Self-pay | Admitting: Surgery

## 2013-12-06 VITALS — BP 175/67 | HR 68 | Ht 69.0 in | Wt 225.2 lb

## 2013-12-06 DIAGNOSIS — M7989 Other specified soft tissue disorders: Secondary | ICD-10-CM

## 2013-12-06 NOTE — Progress Notes (Addendum)
Patient name: Randy Garrett MRN: [id number] DOB: 08/07/1941 Sex: male   Referred by: Dr. Lodema Hong  Reason for referral:  Chief Complaint  Patient presents with  . New Evaluation    pt c/o bilateral LE swelling X 5-6 months    HISTORY OF PRESENT ILLNESS: This is a 73 year old gentleman who was referred to me for bilateral swollen legs.  He states that this has only been going on for the past 3-5 months.  According to his wife, his legs seems to be very skinny.  The patient reports significant swelling in both legs.  He has had a ulcer on the left leg which initially started as a blister.  This has ultimately healed.  He manages this with leg elevation.  He has not tried compression.  He has had some bleeding from his legs, especially when he had his ulcer.  These are not painful.  The patient denies any history of trauma or DVT.  The patient denies a history of coronary artery disease.  He does have a history of tobacco abuse.  Has hypercholesterolemia is managed with a statin.  His hypertension has been poorly controlled but is improving.  He does have a significant history of renal stones.  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 Hypertension   . CAD (coronary artery disease)   . Renal disorder     kidney stone  . Enlarged prostate   . Diverticulitis   . Hyperlipidemia   . Goiter   . Arthritis     right knee instability  . Stroke 1995    left CVa, residual mobility issues  . COPD (chronic obstructive pulmonary disease)     Past Surgical History  Procedure Laterality Date  . Tonsillectomy    . Diverticulitis  1964  . Appendectomy    . Lithotripsy      History   Social History  . Marital Status: Legally Separated    Spouse Name: N/A    Number of Children: N/A  . Years of Education: N/A   Occupational History  . Not on file.   Social History Main Topics  . Smoking status: Former Smoker    Quit  date: 10/07/1993  . Smokeless tobacco: Not on file  . Alcohol Use: No  . Drug Use: No  . Sexual Activity: Not on file   Other Topics Concern  . Not on file   Social History Narrative  . No narrative on file    Family History  Problem Relation Age of Onset  . Cancer Mother     breast    Allergies as of 12/06/2013  . (No Known Allergies)    Current Outpatient Prescriptions on File Prior to Visit  Medication Sig Dispense Refill  . acetaminophen (TYLENOL) 325 MG tablet Take 650 mg by mouth daily as needed. pain      . amLODipine (NORVASC) 10 MG tablet Take 1 tablet (10 mg total) by mouth daily.  90 tablet  1  . aspirin 325 MG tablet Take 325 mg by mouth daily.      . calcium carbonate (TUMS - DOSED IN MG ELEMENTAL CALCIUM) 500 MG chewable tablet Chew 1 tablet by mouth as needed.      . cholecalciferol (VITAMIN D) 1000 UNITS tablet Take 1,000 Units by mouth 2 (two) times daily.      . cloNIDine (CATAPRES) 0.3 MG tablet Take 1 tablet (0.3 mg  total) by mouth 2 (two) times daily.  180 tablet  1  . Garlic 1000 MG CAPS Take by mouth.        Marland Kitchen. gemfibrozil (LOPID) 600 MG tablet Take 1 tablet (600 mg total) by mouth 2 (two) times daily before a meal.  180 tablet  1  . metoprolol succinate (TOPROL-XL) 100 MG 24 hr tablet Take 1 tablet (100 mg total) by mouth daily. Take with or immediately following a meal.  90 tablet  1  . Multiple Vitamins-Minerals (MULTIVITAMIN WITH MINERALS) tablet Take 1 tablet by mouth daily.      . Omega-3 Fatty Acids (FISH OIL) 1000 MG CAPS Take 2 capsules by mouth daily.        . simvastatin (ZOCOR) 20 MG tablet Take 1 tablet (20 mg total) by mouth at bedtime.  90 tablet  1  . triamterene-hydrochlorothiazide (MAXZIDE-25) 37.5-25 MG per tablet Take 1 tablet by mouth daily.  90 tablet  1   No current facility-administered medications on file prior to visit.     REVIEW OF SYSTEMS: Cardiovascular: No chest pain, chest pressure, palpitations, orthopnea, or dyspnea  on exertion. No claudication or rest pain,  No history of DVT or phlebitis.  Positive for bilateral leg swelling and ulceration Pulmonary: No productive cough, asthma or wheezing. Neurologic: No weakness, paresthesias, aphasia, or amaurosis. No dizziness. Hematologic: No bleeding problems or clotting disorders. Musculoskeletal: No joint pain or joint swelling. Gastrointestinal: No blood in stool or hematemesis Genitourinary: No dysuria or hematuria. Psychiatric:: No history of major depression. Integumentary: No rashes or ulcers. Constitutional: No fever or chills.  PHYSICAL EXAMINATION: General: The patient appears their stated age.  Vital signs are BP 175/67  Pulse 68  Ht 5\' 9"  (1.753 m)  Wt 225 lb 3.2 oz (102.15 kg)  BMI 33.24 kg/m2  SpO2 98% HEENT:  No gross abnormalities Pulmonary: Respirations are non-labored Abdomen: Soft and non-tender  Musculoskeletal: There are no major deformities.   Neurologic: No focal weakness or paresthesias are detected, Skin: There are no ulcer or rashes noted. Psychiatric: The patient has normal affect. Cardiovascular: There is a regular rate and rhythm without significant murmur appreciated.  Markedly edematous bilateral lower extremity with evidence of healed ulceration on the left and early ulceration on the right.  Diagnostic Studies: Venous ultrasound of the left leg was ordered and reviewed today.  This shows no evidence of deep vein thrombosis or insufficiency.  The patient does have reflux in his left great saphenous vein with maximum vein diameter of 0.73.    Assessment:  Severe bilateral leg swelling with history of left leg ulcer Plan: The appearance of the patient's leg is quite dramatic.  He has significant lower extremity swelling.  He has a healed ulcer on the left.  He had several areas that have dry eschar on the right.  There is 2-3+ edema.  Ultrasound today showed left great saphenous vein reflux.  While it is certainly  conceivable that the patient's leg swelling is multifactorial, there is definitely a venous component.  Based on the appearance of the patient's legs and his underlying disability from his stroke, I think that he is a very poor candidate for compression stockings because I don't feel he will be able to get these in place.  I think he is at high risk for developing recurrent ulcers, an outer recommend laser ablation at this time to help improve his symptoms.  We will send him for early approval from an insurance perspective.  He will also need imaging of his right leg as well.     Jorge Ny, M.D. [facility] Office: [phone] Pager:  [phone]  I have also  examined the patient and taken history as well.  Patient does have severe venous stasis disease with chronic edema.  His left leg has 2+ edema with recently healed ulcer and severe hyperpigmentation and nodules in the skin.  Right leg has no history of ulcers but has more edema and hyperpigmentation. There is documented gross reflux left greater saphenous system and no DVT or deep venous reflux.  Right leg has not been studied yet.  Do not think patient is candidate for long-leg elastic compression stockings because of the size of his legs and do think he should have laser ablation left great saphenous At this time.  He also needs duplex exam of right lower extremity to determine status of superficial venous system on that side. Will proceed with preserved ejection to perform laser ablation left great saphenous vein to hopefully prevent recurrent ulceration and hopefully diminish edema somewhat.  He does understand that this problem is multifactorial and skin quality will not return to normal.  Dr. Pryor Ochoa

## 2013-12-06 NOTE — Telephone Encounter (Signed)
Pls call pot and let him know I reviewed  His visit with the vascular surgeon. bP still seems to be too high. He needs you to recheck the BP if SBP is 160 or more, he will need to start maxzide 25 mg one daily, continue the other BP meds as he is taking them, and return for MD visit in 6 weeks approx, this will need to be scheduled , thanks , any questions pls  ask

## 2013-12-07 NOTE — Telephone Encounter (Signed)
Will increase the maxzide to 1 1/2 tabs daily if BP is over 160 per Dr. Maurice SmallLast entry was in error

## 2013-12-07 NOTE — Telephone Encounter (Signed)
Called and left message to call back.

## 2013-12-08 NOTE — Telephone Encounter (Signed)
BP was 134/76. No changes made in BP med. 6 week follow up scheduled

## 2013-12-16 ENCOUNTER — Other Ambulatory Visit: Payer: Self-pay | Admitting: *Deleted

## 2013-12-16 DIAGNOSIS — I83893 Varicose veins of bilateral lower extremities with other complications: Secondary | ICD-10-CM

## 2013-12-21 ENCOUNTER — Other Ambulatory Visit: Payer: Self-pay | Admitting: *Deleted

## 2013-12-21 DIAGNOSIS — I83893 Varicose veins of bilateral lower extremities with other complications: Secondary | ICD-10-CM

## 2013-12-24 ENCOUNTER — Encounter: Payer: Self-pay | Admitting: Vascular Surgery

## 2013-12-27 ENCOUNTER — Ambulatory Visit (INDEPENDENT_AMBULATORY_CARE_PROVIDER_SITE_OTHER): Payer: Commercial Managed Care - HMO | Admitting: Vascular Surgery

## 2013-12-27 ENCOUNTER — Encounter: Payer: Self-pay | Admitting: Vascular Surgery

## 2013-12-27 VITALS — BP 184/77 | HR 65 | Resp 18 | Ht 69.0 in | Wt 224.0 lb

## 2013-12-27 DIAGNOSIS — I83893 Varicose veins of bilateral lower extremities with other complications: Secondary | ICD-10-CM

## 2013-12-27 NOTE — Progress Notes (Signed)
Subjective:     Patient ID: Randy Garrett, Randy Garrett   DOB: 29-Nov-1940, 73 y.o.   MRN: [id number]  HPI this 73 year old Randy Garrett and laser ablation left great saphenous vein from the proximal calf to near the saphenofemoral junction performed under local tumescent anesthesia. A total of 2600 J of energy was utilized. He tolerated the procedure well.  Review of Systems     Objective:   Physical Exam BP 184/77  Pulse 65  Resp 18  Ht 5\' 9"  (1.753 m)  Wt 224 lb (101.606 kg)  BMI 33.06 kg/m2       Assessment:     Well-tolerated laser ablation left great saphenous vein performed under local tumescent anesthesia for venous hypertension with swelling and ulceration    Plan:     Return in one week for venous duplex exam left leg to confirm closure left great saphenous vein and also to perform reflux study right lower extremity for similar problem

## 2013-12-27 NOTE — Progress Notes (Signed)
   Laser Ablation Procedure      Date: 12/27/2013    Randy Garrett DOB:1941/05/01  Consent signed: Yes  Surgeon:J.D. Hart RochesterLawson  Procedure: Laser Ablation: left Greater Saphenous Vein  BP 184/77  Pulse 65  Resp 18  Ht 5\' 9"  (1.753 m)  Wt 224 lb (101.606 kg)  BMI 33.06 kg/m2  Start time: 9:10   End time: 9:50  Tumescent Anesthesia:  480cc 0.9% NaCl with 50 cc Lidocaine HCL with 1% Epi and 15 cc 8.4% NaHCO3  Local Anesthesia: 5 cc Lidocaine HCL and NaHCO3 (ratio 2:1)  Pulsed mode: 15 watts, 500ms delay, 1.0 duration Total energy: 2544, total pulses: 170, total time: 2:50     Patient tolerated procedure well: Yes  Notes:   Description of Procedure:  After marking the course of the saphenous vein and the secondary varicosities in the standing position, the patient was placed on the operating table in the supine position, and the left leg was prepped and draped in sterile fashion. Local anesthetic was administered, and under ultrasound guidance the saphenous vein was accessed with a micro needle and guide wire; then the micro puncture sheath was placed. A guide wire was inserted to the saphenofemoral junction, followed by a 5 french sheath.  The position of the sheath and then the laser fiber below the junction was confirmed using the ultrasound and visualization of the aiming beam.  Tumescent anesthesia was administered along the course of the saphenous vein using ultrasound guidance. Protective laser glasses were placed on the patient, and the laser was fired at 15 watt pulsed mode advancing 1-2 mm per sec.  For a total of 2544 joules.  A steri strip was applied to the puncture site.   ABD pads and thigh high compression stockings were applied.  Ace wrap bandages were applied over the phlebectomy sites and at the top of the saphenofemoral junction.  Blood loss was less than 15 cc.  The patient ambulated out of the operating room having tolerated the procedure well.

## 2013-12-28 ENCOUNTER — Telehealth: Payer: Self-pay | Admitting: *Deleted

## 2013-12-28 NOTE — Telephone Encounter (Signed)
Reached patient at home. No complaints. Following all instructions.

## 2013-12-31 ENCOUNTER — Encounter: Payer: Self-pay | Admitting: Vascular Surgery

## 2014-01-03 ENCOUNTER — Ambulatory Visit (HOSPITAL_COMMUNITY)
Admission: RE | Admit: 2014-01-03 | Discharge: 2014-01-03 | Disposition: A | Discharge status: Home / Self Care | Payer: Medicare HMO | Source: Ambulatory Visit | Attending: Vascular Surgery | Admitting: Vascular Surgery

## 2014-01-03 ENCOUNTER — Other Ambulatory Visit: Payer: Self-pay | Admitting: *Deleted

## 2014-01-03 ENCOUNTER — Encounter: Payer: Self-pay | Admitting: Vascular Surgery

## 2014-01-03 ENCOUNTER — Encounter (HOSPITAL_COMMUNITY): Payer: Medicare HMO

## 2014-01-03 ENCOUNTER — Other Ambulatory Visit: Payer: Self-pay | Admitting: Vascular Surgery

## 2014-01-03 ENCOUNTER — Ambulatory Visit (INDEPENDENT_AMBULATORY_CARE_PROVIDER_SITE_OTHER): Payer: Commercial Managed Care - HMO | Admitting: Vascular Surgery

## 2014-01-03 VITALS — BP 198/96 | HR 75 | Resp 18 | Ht 69.0 in | Wt 234.0 lb

## 2014-01-03 DIAGNOSIS — I83893 Varicose veins of bilateral lower extremities with other complications: Secondary | ICD-10-CM

## 2014-01-03 HISTORY — PX: LASER ABLATION: SHX1947

## 2014-01-03 NOTE — Progress Notes (Signed)
Subjective:     Patient ID: Randy Garrett, male   DOB: January 05, 1941, 73 y.o.   MRN: [id number]  HPI this 73 year old male returns 1 week post laser ablation left great saphenous vein performed for gross reflux in the left great saphenous system and severe edema with severe skin changes and history of ulceration left leg. He denies any chest pain hemoptysis dyspnea on exertion or progression of distal edema. He has had mild to moderate discomfort along the course of the left great saphenous vein which is improving. His legs were then wrapped with Ace wraps because he is not a candidate for last compression stockings. He also has chronic edema pain and skin changes on the contralateral right leg in a similar pattern.  Past Medical History  Diagnosis Date  . Hypertension   . CAD (coronary artery disease)   . Renal disorder     kidney stone  . Enlarged prostate   . Diverticulitis   . Hyperlipidemia   . Goiter   . Arthritis     right knee instability  . Stroke 1995    left CVa, residual mobility issues  . COPD (chronic obstructive pulmonary disease)     History  Substance Use Topics  . Smoking status: Former Smoker    Quit date: 10/07/1993  . Smokeless tobacco: Never Used  . Alcohol Use: No    Family History  Problem Relation Age of Onset  . Cancer Mother     breast    No Known Allergies  Current outpatient prescriptions:acetaminophen (TYLENOL) 325 MG tablet, Take 650 mg by mouth daily as needed. pain, Disp: , Rfl: ;  amLODipine (NORVASC) 10 MG tablet, Take 1 tablet (10 mg total) by mouth daily., Disp: 90 tablet, Rfl: 1;  aspirin 325 MG tablet, Take 325 mg by mouth daily., Disp: , Rfl: ;  calcium carbonate (TUMS - DOSED IN MG ELEMENTAL CALCIUM) 500 MG chewable tablet, Chew 1 tablet by mouth as needed., Disp: , Rfl:  cholecalciferol (VITAMIN D) 1000 UNITS tablet, Take 1,000 Units by mouth 2 (two) times daily., Disp: , Rfl: ;  cloNIDine (CATAPRES) 0.3 MG tablet, Take 1 tablet (0.3  mg total) by mouth 2 (two) times daily., Disp: 180 tablet, Rfl: 1;  Garlic 1000 MG CAPS, Take by mouth.  , Disp: , Rfl: ;  gemfibrozil (LOPID) 600 MG tablet, Take 1 tablet (600 mg total) by mouth 2 (two) times daily before a meal., Disp: 180 tablet, Rfl: 1 metoprolol succinate (TOPROL-XL) 100 MG 24 hr tablet, Take 1 tablet (100 mg total) by mouth daily. Take with or immediately following a meal., Disp: 90 tablet, Rfl: 1;  Multiple Vitamins-Minerals (MULTIVITAMIN WITH MINERALS) tablet, Take 1 tablet by mouth daily., Disp: , Rfl: ;  Omega-3 Fatty Acids (FISH OIL) 1000 MG CAPS, Take 2 capsules by mouth daily.  , Disp: , Rfl:  triamterene-hydrochlorothiazide (MAXZIDE-25) 37.5-25 MG per tablet, Take 1 tablet by mouth daily., Disp: 90 tablet, Rfl: 1;  simvastatin (ZOCOR) 20 MG tablet, Take 1 tablet (20 mg total) by mouth at bedtime., Disp: 90 tablet, Rfl: 1  BP 198/96  Pulse 75  Resp 18  Ht 5\' 9"  (1.753 m)  Wt 234 lb (106.142 kg)  BMI 34.54 kg/m2  Body mass index is 34.54 kg/(m^2).           Review of Systems as chest pain, dyspnea on exertion, PND, orthopnea, hemoptysis, claudication to    Objective:   Physical Exam BP 198/96  Pulse 75  Resp  18  Ht 5\' 9"  (1.753 m)  Wt 234 lb (106.142 kg)  BMI 34.54 kg/m2  General well-developed well-nourished male no apparent stress alert and oriented x3 Lungs no rhonchi or wheezing Left leg with hyperpigmentation below the knee and chronic edema but no active ulcer Right leg with similar hyperpigmentation severe skin changes no active ulceration and 1-2+ edema. 3 posterior cells pedis pulse palpable bilaterally.  They ordered a venous duplex exam the left leg which a view to interpret. Left great saphenous vein is closed up to a 0.1 0.9 cm from the saphenofemoral junction with no DVT. Also ordered duplex scan of the right leg which are reviewed and interpreted and performed an independent SonoSite bedside ultrasound exam. He does have a similar  situation with gross reflux throughout the right great saphenous vein from the saphenofemoral junction to the knee with no DVT.     Assessment:     Successful ablation left great saphenous vein with no DVT Severe venous hypertension with chronic edema and skin type changes right leg. Patient unable to wear along with elastic compression stockings because of the size of his legs. He is having severe skin changes in symptoms which are affecting his daily living    Plan:     Patient needs laser ablation right great saphenous finding to prevent further progression of skin changes and to relieve his symptoms. We'll proceed with precertification to perform this in the near future

## 2014-01-18 ENCOUNTER — Other Ambulatory Visit: Payer: Self-pay | Admitting: *Deleted

## 2014-01-18 DIAGNOSIS — I83893 Varicose veins of bilateral lower extremities with other complications: Secondary | ICD-10-CM

## 2014-01-25 ENCOUNTER — Telehealth: Payer: Self-pay

## 2014-01-25 ENCOUNTER — Ambulatory Visit (INDEPENDENT_AMBULATORY_CARE_PROVIDER_SITE_OTHER): Payer: Medicare HMO | Admitting: Family Medicine

## 2014-01-25 ENCOUNTER — Encounter: Payer: Self-pay | Admitting: Family Medicine

## 2014-01-25 VITALS — BP 134/74 | HR 65 | Resp 16 | Ht 69.0 in | Wt 231.8 lb

## 2014-01-25 DIAGNOSIS — N2889 Other specified disorders of kidney and ureter: Secondary | ICD-10-CM

## 2014-01-25 DIAGNOSIS — M7989 Other specified soft tissue disorders: Secondary | ICD-10-CM

## 2014-01-25 DIAGNOSIS — E8809 Other disorders of plasma-protein metabolism, not elsewhere classified: Secondary | ICD-10-CM

## 2014-01-25 DIAGNOSIS — E785 Hyperlipidemia, unspecified: Secondary | ICD-10-CM

## 2014-01-25 DIAGNOSIS — I872 Venous insufficiency (chronic) (peripheral): Secondary | ICD-10-CM

## 2014-01-25 DIAGNOSIS — R5381 Other malaise: Secondary | ICD-10-CM

## 2014-01-25 DIAGNOSIS — N289 Disorder of kidney and ureter, unspecified: Secondary | ICD-10-CM

## 2014-01-25 DIAGNOSIS — R600 Localized edema: Secondary | ICD-10-CM

## 2014-01-25 DIAGNOSIS — I10 Essential (primary) hypertension: Secondary | ICD-10-CM

## 2014-01-25 DIAGNOSIS — E860 Dehydration: Secondary | ICD-10-CM

## 2014-01-25 DIAGNOSIS — R609 Edema, unspecified: Secondary | ICD-10-CM

## 2014-01-25 DIAGNOSIS — R5383 Other fatigue: Secondary | ICD-10-CM

## 2014-01-25 DIAGNOSIS — I8310 Varicose veins of unspecified lower extremity with inflammation: Secondary | ICD-10-CM

## 2014-01-25 LAB — TSH: TSH: 2.028 u[IU]/mL (ref 0.350–4.500)

## 2014-01-25 LAB — LIPID PANEL
CHOLESTEROL: 215 mg/dL — AB (ref 0–200)
HDL: 52 mg/dL (ref >39)
LDL Cholesterol: 146 mg/dL — ABNORMAL HIGH (ref 0–99)
Total CHOL/HDL Ratio: 4.1 Ratio
Triglycerides: 87 mg/dL (ref <150)
VLDL: 17 mg/dL (ref 0–40)

## 2014-01-25 LAB — COMPREHENSIVE METABOLIC PANEL
ALK PHOS: 125 U/L — AB (ref 39–117)
ALT: 17 U/L (ref 0–53)
AST: 29 U/L (ref 0–37)
Albumin: 2 g/dL — ABNORMAL LOW (ref 3.5–5.2)
BILIRUBIN TOTAL: 0.2 mg/dL (ref 0.2–1.2)
BUN: 45 mg/dL — AB (ref 6–23)
CO2: 25 mEq/L (ref 19–32)
Calcium: 8.2 mg/dL — ABNORMAL LOW (ref 8.4–10.5)
Chloride: 109 mEq/L (ref 96–112)
Creat: 3.28 mg/dL — ABNORMAL HIGH (ref 0.50–1.35)
Glucose, Bld: 72 mg/dL (ref 70–99)
Potassium: 3.7 mEq/L (ref 3.5–5.3)
SODIUM: 142 meq/L (ref 135–145)
Total Protein: 4.9 g/dL — ABNORMAL LOW (ref 6.0–8.3)

## 2014-01-25 MED ORDER — TRIAMTERENE-HCTZ 37.5-25 MG PO TABS: 1.0000 | ORAL_TABLET | Freq: Every day | ORAL | Status: AC

## 2014-01-25 NOTE — Patient Instructions (Signed)
F/u in 4 month, call if you need me before  BP is excellent, heart and lung exam is good  We are trying to get a sooner appt with vascular surgery re rught uopper extremity swelling from hand to elbow which you report developed 2 days after your procedure on the left leg, we will be in touch with you about this.   Lipid, cmp and TSH today  Please continue to check on the shingles shot which you need, and all the best with your colonsocopy

## 2014-01-25 NOTE — Assessment & Plan Note (Signed)
3.5 week h/o RUE edema which is pitting, reportedly started 2 days post op on left lower extremity, vascular surgery to eval

## 2014-01-25 NOTE — Progress Notes (Signed)
   Subjective:    Patient ID: Randy Garrett, male    DOB: 11/29/1940, 73 y.o.   MRN: [id number]  HPI The PT is here for follow up and re-evaluation of chronic medical conditions, medication management and review of any available recent lab and radiology data.  Preventive health is updated, specifically  Cancer screening and Immunization.   Questions or concerns regarding consultations or procedures which the PT has had in the interim are  Addressed.Had successful vascular procedure to left lower ext, no ulcers but still slightly swollen, states he has upcoming procedure scheduled for right lower extremity. States legs still feel very heavy and  Seem swollen Reports 2 days after the procedure he has had RUE swelling , pitting to elbow, no weaKNESS OR NUMBNESS in the hand, no h/o vascular access or puncture to affected upper extremity, no h/o fever no h/o redness in the the RUE The PT denies any adverse reactions to current medications since the last visit.  Denies chest pain, pND or orthopea , [facility]      Review of Systems See HPI Denies recent fever or chills. Denies sinus pressure, nasal congestion, ear pain or sore throat. Denies chest congestion, productive cough or wheezing. Denies chest pains, palpitations and leg swelling Denies abdominal pain, nausea, vomiting,diarrhea or constipation.   Denies dysuria, frequency, hesitancy or incontinence. Denies headaches, seizures, numbness, or tingling. Denies depression, anxiety or insomnia.        Objective:   Physical Exam BP 134/74  Pulse 65  Resp 16  Ht 5\' 9"  (1.753 m)  Wt 231 lb 12.8 oz (105.144 kg)  BMI 34.22 kg/m2  SpO2 97% Patient alert and oriented and in no cardiopulmonary distress.  HEENT: No facial asymmetry, EOMI, no sinus tenderness,  oropharynx pink and moist.  Neck adequate ROM , though reduced, No JVD, no adenopathy.  Chest: Clear to auscultation bilaterally.  CVS: S1,  S2 no murmurs, no S3.  ABD: Soft non tender.  Ext: bilateral lower extremity edema , ;left greater than right but reduced since last visit, marked 2 to 3 plus pitting edema of right upper extremity from hand to elbow, no warmth or erythema of affected area  MS: decreased  ROM spine, shoulders, hips and knees.  Skin: hyperpigmentation and skin changes associated with vascular disease evident on both lower extremities. No skin changes noted on either upper extremity  Psych: Good eye contact, normal affect. Memory intact not anxious or depressed appearing.  CNS: CN 2-12 intact, power, normal throughout.        Assessment & Plan:

## 2014-01-25 NOTE — Telephone Encounter (Signed)
Phone call from Dr. Anthony SarSimpson's office; requested evaluation this week for right UE swelling from hand up to elbow.  Dr. Lodema HongSimpson stated pt. C/o onset of swelling right arm, 2 days post left LE GSV laser ablation.  Requested [facility] to further evaluate.  Discussed w/ Dr. Arbie CookeyEarly.  Rec'd v.o. For right UE Venous duplex and office exam this week.  Pt. Will be notified of appt.

## 2014-01-26 ENCOUNTER — Other Ambulatory Visit: Payer: Self-pay | Admitting: Family Medicine

## 2014-01-26 ENCOUNTER — Encounter: Payer: Self-pay | Admitting: Family

## 2014-01-26 ENCOUNTER — Ambulatory Visit: Payer: Medicare Other | Admitting: Family Medicine

## 2014-01-26 ENCOUNTER — Encounter: Payer: Self-pay | Admitting: Vascular Surgery

## 2014-01-26 ENCOUNTER — Ambulatory Visit (INDEPENDENT_AMBULATORY_CARE_PROVIDER_SITE_OTHER): Payer: Commercial Managed Care - HMO | Admitting: Family

## 2014-01-26 ENCOUNTER — Ambulatory Visit (HOSPITAL_COMMUNITY)
Admission: RE | Admit: 2014-01-26 | Discharge: 2014-01-26 | Disposition: A | Discharge status: Home / Self Care | Payer: Medicare HMO | Source: Ambulatory Visit | Attending: Family | Admitting: Family

## 2014-01-26 ENCOUNTER — Telehealth: Payer: Self-pay

## 2014-01-26 VITALS — BP 150/77 | HR 58 | Temp 97.1°F | Resp 14 | Ht 69.0 in | Wt 231.0 lb

## 2014-01-26 DIAGNOSIS — M7989 Other specified soft tissue disorders: Secondary | ICD-10-CM | POA: Insufficient documentation

## 2014-01-26 DIAGNOSIS — E8809 Other disorders of plasma-protein metabolism, not elsewhere classified: Secondary | ICD-10-CM

## 2014-01-26 DIAGNOSIS — Z86010 Personal history of colonic polyps: Secondary | ICD-10-CM

## 2014-01-26 DIAGNOSIS — N200 Calculus of kidney: Secondary | ICD-10-CM

## 2014-01-26 NOTE — Telephone Encounter (Signed)
Phone call to daughter.  Stated the Urologist requested pt. be evaluated for right arm swelling, to rule out DVT.  Appt. changed to today @ 12:30 PM.   Pt. and daughter agreed.

## 2014-01-26 NOTE — Patient Instructions (Signed)
Venous Stasis or Chronic Venous Insufficiency Chronic venous insufficiency, also called venous stasis, is a condition that affects the veins in the legs. The condition prevents blood from being pumped through these veins effectively. Blood may no longer be pumped effectively from the legs back to the heart. This condition can range from mild to severe. With proper treatment, you should be able to continue with an active life. CAUSES  Chronic venous insufficiency occurs when the vein walls become stretched, weakened, or damaged or when valves within the vein are damaged. Some common causes of this include:  High blood pressure inside the veins (venous hypertension).  Increased blood pressure in the leg veins from long periods of sitting or standing.  A blood clot that blocks blood flow in a vein (deep vein thrombosis).  Inflammation of a superficial vein (phlebitis) that causes a blood clot to form. RISK FACTORS Various things can make you more likely to develop chronic venous insufficiency, including:  Family history of this condition.  Obesity.  Pregnancy.  Sedentary lifestyle.  Smoking.  Jobs requiring long periods of standing or sitting in one place.  Being a certain age. Women in their 40s and 50s and men in their 70s are more likely to develop this condition. SIGNS AND SYMPTOMS  Symptoms may include:   Varicose veins.  Skin breakdown or ulcers.  Reddened or discolored skin on the leg.  Brown, smooth, tight, and painful skin just above the ankle, usually on the inside surface (lipodermatosclerosis).  Swelling. DIAGNOSIS  To diagnose this condition, your health care provider will take a medical history and do a physical exam. The following tests may be ordered to confirm the diagnosis:  Duplex ultrasound A procedure that produces a picture of a blood vessel and nearby organs and also provides information on blood flow through the blood vessel.  Plethysmography A  procedure that tests blood flow.  A venogram, or venography A procedure used to look at the veins using X-ray and dye. TREATMENT The goals of treatment are to help you return to an active life and to minimize pain or disability. Treatment will depend on the severity of the condition. Medical procedures may be needed for severe cases. Treatment options may include:   Use of compression stockings. These can help with symptoms and lower the chances of the problem getting worse, but they do not cure the problem.  Sclerotherapy A procedure involving an injection of a material that "dissolves" the damaged veins. Other veins in the network of blood vessels take over the function of the damaged veins.  Surgery to remove the vein or cut off blood flow through the vein (vein stripping or laser ablation surgery).  Surgery to repair a valve. HOME CARE INSTRUCTIONS   Wear compression stockings as directed by your health care provider.  Only take over-the-counter or prescription medicines for pain, discomfort, or fever as directed by your health care provider.  Follow up with your health care provider as directed. SEEK MEDICAL CARE IF:   You have redness, swelling, or increasing pain in the affected area.  You see a red streak or line that extends up or down from the affected area.  You have a breakdown or loss of skin in the affected area, even if the breakdown is small.  You have an injury to the affected area. SEEK IMMEDIATE MEDICAL CARE IF:   You have an injury and open wound in the affected area.  Your pain is severe and does not improve with   medicine.  You have sudden numbness or weakness in the foot or ankle below the affected area, or you have trouble moving your foot or ankle.  You have a fever or persistent symptoms for more than 2 3 days.  You have a fever and your symptoms suddenly get worse. MAKE SURE YOU:   Understand these instructions.  Will watch your condition.  Will  get help right away if you are not doing well or get worse. Document Released: 01/27/2007 Document Revised: 07/14/2013 Document Reviewed: 05/31/2013 [employer] Patient Information 2014 [employer].  

## 2014-01-26 NOTE — Progress Notes (Signed)
Established Venous Insufficiency  History of Present Illness  Randy Garrett is a 73 y.o. (1941/08/15) male patient of Dr. Hart RochesterLawson who is status post laser ablation left great saphenous vein performed for gross reflux in the left great saphenous system and severe edema with severe skin changes and history of ulceration left leg. D. Lawson's last progress note indicates that the patient's right GSV is amenable to ablation. He presents today with c/o RUE swelling from hand to elbow that started 2 days after the left leg GSV ablation. 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 He has been elevating both legs and one of the left lower leg ulcers have healed. He has not been elevating his arms. Sees Dr. Gaynelle Arabianonald Davis, nephrologist.   Past Medical History  Diagnosis Date  . Hypertension   . CAD (coronary artery disease)   . Renal disorder     kidney stone  . Enlarged prostate   . Diverticulitis   . Hyperlipidemia   . Goiter   . Arthritis     right knee instability  . Stroke 1995    left CVa, residual mobility issues  . COPD (chronic obstructive pulmonary disease)    Past Surgical History  Procedure Laterality Date  . Tonsillectomy    . Diverticulitis  1964  . Appendectomy    . Lithotripsy    . Laser ablation Left January 03, 2014   History   Social History  . Marital Status: Legally Separated    Spouse Name: N/A    Number of Children: N/A  . Years of Education: N/A   Occupational History  . Not on file.   Social History Main Topics  . Smoking status: Former Smoker    Quit date: 10/07/1993  . Smokeless tobacco: Never Used  . Alcohol Use: No  . Drug Use: No  . Sexual Activity: Not on file   Other Topics Concern  . Not on file   Social History Narrative  . No narrative on file   Family History  Problem Relation Age of Onset  . Cancer  Mother     breast   Current Outpatient Prescriptions on File Prior to Visit  Medication Sig Dispense Refill  . acetaminophen (TYLENOL) 325 MG tablet Take 650 mg by mouth daily as needed. pain      . amLODipine (NORVASC) 10 MG tablet Take 1 tablet (10 mg total) by mouth daily.  90 tablet  1  . aspirin 325 MG tablet Take 325 mg by mouth daily.      . calcium carbonate (TUMS - DOSED IN MG ELEMENTAL CALCIUM) 500 MG chewable tablet Chew 1 tablet by mouth as needed.      . cholecalciferol (VITAMIN D) 1000 UNITS tablet Take 1,000 Units by mouth 2 (two) times daily.      . cloNIDine (CATAPRES) 0.3 MG tablet Take 1 tablet (0.3 mg total) by mouth 2 (two) times daily.  180 tablet  1  . docusate sodium (COLACE) 100 MG capsule Take 100 mg by mouth daily as needed for mild constipation.      . Garlic 1000 MG CAPS Take by mouth.        Marland Kitchen. gemfibrozil (LOPID) 600 MG tablet Take 1 tablet (600 mg total) by mouth 2 (two) times daily before a meal.  180 tablet  1  . metoprolol succinate (TOPROL-XL) 100 MG 24 hr  tablet Take 1 tablet (100 mg total) by mouth daily. Take with or immediately following a meal.  90 tablet  1  . Multiple Vitamins-Minerals (MULTIVITAMIN WITH MINERALS) tablet Take 1 tablet by mouth daily.      . Omega-3 Fatty Acids (FISH OIL) 1000 MG CAPS Take 2 capsules by mouth daily.        . simvastatin (ZOCOR) 20 MG tablet Take 1 tablet (20 mg total) by mouth at bedtime.  90 tablet  1  . triamterene-hydrochlorothiazide (MAXZIDE-25) 37.5-25 MG per tablet Take 1 tablet by mouth daily.  90 tablet  1   No current facility-administered medications on file prior to visit.   No Known Allergies   REVIEW OF SYSTEMS: see HPI for pertinent positives and negatives  Physical Examination  Filed Vitals:   01/26/14 1355  BP: 150/77  Pulse: 58  Temp: 97.1 F (36.2 C)  TempSrc: Oral  Resp: 14  Height: 5\' 9"  (1.753 m)  Weight: 231 lb (104.781 kg)  SpO2: 97%  Body mass index is 34.1  kg/(m^2).   PHYSICAL EXAMINATION: General: The patient appears their stated age. Obese. HEENT:  No gross abnormalities Pulmonary: Respirations are non-labored Abdomen: Soft and non-tender Musculoskeletal: There are no major deformities.   Neurologic: No focal weakness or paresthesias are detected, Skin: There are no ulcer or rashes noted. Psychiatric: The patient has normal affect. Cardiovascular: There is a regular rate and rhythm without significant murmur appreciated. Right forearm has 1+pitting edema, right hand has 3+ pitting edema, left hand has 2+ pitting edema.   Non-Invasive Vascular Imaging   UPPER EXTREMITY VENOUS DUPLEX EVALUATION (01/26/2014)     INDICATION: Right arm swelling    PREVIOUS INTERVENTION(S): Left great saphenous vein laser ablation on 12/27/13    DUPLEX EXAM:      Internal Jugular Vein Subclavian Vein Axillary Vein  Brachial Vein  Cephalic Vein  Basilic Vein   Right Left Right Left Right Left Right Left Right Left Right Left  Spontaneous + + D + D  D  NV  +   Phasic + + + + +  +  NV  +   Compressible + + + + +  +  NV  +   Augmentation + + + + +  +  NV  +   Competent                 Legend:  + = Yes,  -  = No; P = Partial, D = Decreased, NV = Not Visualized, NA = Not Examined    Thrombosis                                                                    Legend:  A = Acute, C = Chronic, O = Obstructive, P = Partially Obstructive     ADDITIONAL FINDINGS:   Limited visualization of the right clavicle level veins due to anatomic limitations.   Flow in the right subclavian, axillary and brachial vein demonstrated mild pulsatility.    The right cephalic vein was not adequately visualized.     IMPRESSION: No evidence of deep or superficial thrombus noted in the right upper extremity.       Medical Decision Making  Randy Garrett  is a 73 y.o. male who presents today with c/o RUE swelling from hand to elbow that started 2 days after the  left leg GSV ablation. 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 He has been elevating both legs and one of the left lower leg ulcers have healed. He has not been elevating his arms. Sees Dr. Gaynelle Arabianonald Davis, nephrologist.  Based on today's exam and venous Duplex result, and after discussing with Dr. Edilia Boickson, the right arm edema is not related to the left GSV ablation, there is no evidence of DVT in the right upper extremity.  Patient, with wife and daughter present, was instructed to elevate both arms and feet above the level of his heart. He has a follow up appointment to see Dr. Hart RochesterLawson next week.    Carma LairSuzanne L Nickel, RN, MSN, FNP-C [facility] Office: [phone]  01/26/2014, 1:53 PM  Clinic MD: Edilia Boickson

## 2014-01-26 NOTE — Telephone Encounter (Signed)
Message copied by Phillips OdorPULLINS, Raymundo Rout S on Wed Jan 26, 2014 11:24 AM ------      Message from: Marcellus ScottBRYANT, ALLISON M      Created: Wed Jan 26, 2014 10:10 AM      Regarding: possible clot just had surgery      Contact: [phone]       Randy Garrett daughter called regarding his forearm. She said Dr. Earlene Plateravis his urologist thinks it could be a possible clot. Please call. Ask for Randy Garrett.            Thanks      Revonda StandardAllison ------

## 2014-01-27 ENCOUNTER — Other Ambulatory Visit (HOSPITAL_COMMUNITY): Payer: Commercial Managed Care - HMO

## 2014-01-27 ENCOUNTER — Ambulatory Visit: Payer: Commercial Managed Care - HMO | Admitting: Vascular Surgery

## 2014-01-28 ENCOUNTER — Telehealth: Payer: Self-pay | Admitting: Family Medicine

## 2014-01-28 ENCOUNTER — Encounter (HOSPITAL_COMMUNITY): Payer: Self-pay | Admitting: Emergency Medicine

## 2014-01-28 ENCOUNTER — Emergency Department (HOSPITAL_COMMUNITY)
Admission: EM | Admit: 2014-01-28 | Discharge: 2014-01-28 | Discharge status: Against Medical Advice | Payer: Medicare HMO | Attending: Emergency Medicine | Admitting: Emergency Medicine

## 2014-01-28 DIAGNOSIS — Z8673 Personal history of transient ischemic attack (TIA), and cerebral infarction without residual deficits: Secondary | ICD-10-CM | POA: Insufficient documentation

## 2014-01-28 DIAGNOSIS — Z7982 Long term (current) use of aspirin: Secondary | ICD-10-CM | POA: Insufficient documentation

## 2014-01-28 DIAGNOSIS — Z8719 Personal history of other diseases of the digestive system: Secondary | ICD-10-CM | POA: Insufficient documentation

## 2014-01-28 DIAGNOSIS — Z79899 Other long term (current) drug therapy: Secondary | ICD-10-CM | POA: Insufficient documentation

## 2014-01-28 DIAGNOSIS — I10 Essential (primary) hypertension: Secondary | ICD-10-CM | POA: Insufficient documentation

## 2014-01-28 DIAGNOSIS — M1710 Unilateral primary osteoarthritis, unspecified knee: Secondary | ICD-10-CM | POA: Insufficient documentation

## 2014-01-28 DIAGNOSIS — J4489 Other specified chronic obstructive pulmonary disease: Secondary | ICD-10-CM | POA: Insufficient documentation

## 2014-01-28 DIAGNOSIS — J449 Chronic obstructive pulmonary disease, unspecified: Secondary | ICD-10-CM | POA: Insufficient documentation

## 2014-01-28 DIAGNOSIS — IMO0002 Reserved for concepts with insufficient information to code with codable children: Secondary | ICD-10-CM

## 2014-01-28 DIAGNOSIS — N179 Acute kidney failure, unspecified: Secondary | ICD-10-CM | POA: Insufficient documentation

## 2014-01-28 DIAGNOSIS — Z87442 Personal history of urinary calculi: Secondary | ICD-10-CM | POA: Insufficient documentation

## 2014-01-28 DIAGNOSIS — Z87891 Personal history of nicotine dependence: Secondary | ICD-10-CM | POA: Insufficient documentation

## 2014-01-28 DIAGNOSIS — E785 Hyperlipidemia, unspecified: Secondary | ICD-10-CM | POA: Insufficient documentation

## 2014-01-28 DIAGNOSIS — I2510 Atherosclerotic heart disease of native coronary artery without angina pectoris: Secondary | ICD-10-CM | POA: Insufficient documentation

## 2014-01-28 HISTORY — DX: Other specified disorders of kidney and ureter: N28.89

## 2014-01-28 HISTORY — DX: Calculus of kidney: N20.0

## 2014-01-28 LAB — CBC WITH DIFFERENTIAL/PLATELET
BASOS ABS: 0 10*3/uL (ref 0.0–0.1)
Basophils Relative: 0 % (ref 0–1)
EOS ABS: 0 10*3/uL (ref 0.0–0.7)
EOS PCT: 0 % (ref 0–5)
HCT: 40.4 % (ref 39.0–52.0)
Hemoglobin: 14.1 g/dL (ref 13.0–17.0)
LYMPHS PCT: 9 % — AB (ref 12–46)
Lymphs Abs: 0.9 10*3/uL (ref 0.7–4.0)
MCH: 31.8 pg (ref 26.0–34.0)
MCHC: 34.9 g/dL (ref 30.0–36.0)
MCV: 91.2 fL (ref 78.0–100.0)
Monocytes Absolute: 0.7 10*3/uL (ref 0.1–1.0)
Monocytes Relative: 7 % (ref 3–12)
Neutro Abs: 8.4 10*3/uL — ABNORMAL HIGH (ref 1.7–7.7)
Neutrophils Relative %: 84 % — ABNORMAL HIGH (ref 43–77)
PLATELETS: 272 10*3/uL (ref 150–400)
RBC: 4.43 MIL/uL (ref 4.22–5.81)
RDW: 13.2 % (ref 11.5–15.5)
WBC: 10 10*3/uL (ref 4.0–10.5)

## 2014-01-28 LAB — BASIC METABOLIC PANEL
BUN: 44 mg/dL — ABNORMAL HIGH (ref 6–23)
CALCIUM: 8.8 mg/dL (ref 8.4–10.5)
CO2: 24 mEq/L (ref 19–32)
CREATININE: 3.62 mg/dL — AB (ref 0.50–1.35)
Chloride: 101 mEq/L (ref 96–112)
Glucose, Bld: 77 mg/dL (ref 70–99)
Potassium: 3.3 mEq/L — ABNORMAL LOW (ref 3.5–5.3)
Sodium: 140 mEq/L (ref 135–145)

## 2014-01-28 NOTE — ED Provider Notes (Signed)
CSN: [id number]     Arrival date & time 01/28/14  1239 History   First MD Initiated Contact with Patient 01/28/14 1247     Chief Complaint  Patient presents with  . Dehydration      HPI Pt was seen at 1255. Per pt and his family, c/o gradual onset and persistence of constant "abnormal lab work" for the past several days. Pt states he was evaluated at his PMD's office several days ago and had routine labs completed. Pt was called after those labs resulted and told to "drink a lot of water." Pt had his labs rechecked today. Pt was called today, told he was "dehydrated" and to go to the ED for further evaluation. Pt and family do not know what exactly was "abnormal" on his lab work. Pt himself denies any complaints.     Past Medical History  Diagnosis Date  . Hypertension   . CAD (coronary artery disease)   . Renal disorder     kidney stone  . Enlarged prostate   . Diverticulitis   . Hyperlipidemia   . Goiter   . Arthritis     right knee instability  . Stroke 1995    left CVa, residual mobility issues  . COPD (chronic obstructive pulmonary disease)    Past Surgical History  Procedure Laterality Date  . Tonsillectomy    . Diverticulitis  1964  . Appendectomy    . Lithotripsy    . Laser ablation Left January 03, 2014   Family History  Problem Relation Age of Onset  . Cancer Mother     breast   History  Substance Use Topics  . Smoking status: Former Smoker    Quit date: 10/07/1993  . Smokeless tobacco: Never Used  . Alcohol Use: No    Review of Systems ROS: Statement: All systems negative except as marked or noted in the HPI; Constitutional: Negative for fever and chills. ; ; Eyes: Negative for eye pain, redness and discharge. ; ; ENMT: Negative for ear pain, hoarseness, nasal congestion, sinus pressure and sore throat. ; ; Cardiovascular: Negative for chest pain, palpitations, diaphoresis, dyspnea and peripheral edema. ; ; Respiratory: Negative for cough, wheezing and  stridor. ; ; Gastrointestinal: Negative for nausea, vomiting, diarrhea, abdominal pain, blood in stool, hematemesis, jaundice and rectal bleeding. . ; ; Genitourinary: Negative for dysuria, flank pain and hematuria. ; ; Musculoskeletal: Negative for back pain and neck pain. Negative for swelling and trauma.; ; Skin: Negative for pruritus, rash, abrasions, blisters, bruising and skin lesion.; ; Neuro: Negative for headache, lightheadedness and neck stiffness. Negative for weakness, altered level of consciousness , altered mental status, extremity weakness, paresthesias, involuntary movement, seizure and syncope.      Allergies  Review of patient's allergies indicates no known allergies.  Home Medications   Prior to Admission medications   Medication Sig Start Date End Date Taking? Authorizing Provider  acetaminophen (TYLENOL) 325 MG tablet Take 650 mg by mouth daily as needed. pain    Historical Provider, MD  amLODipine (NORVASC) 10 MG tablet Take 1 tablet (10 mg total) by mouth daily. 11/02/13 11/02/14  Fayrene Helper, MD  aspirin 325 MG tablet Take 325 mg by mouth daily.    Historical Provider, MD  calcium carbonate (TUMS - DOSED IN MG ELEMENTAL CALCIUM) 500 MG chewable tablet Chew 1 tablet by mouth as needed.    Historical Provider, MD  cholecalciferol (VITAMIN D) 1000 UNITS tablet Take 1,000 Units by mouth 2 (two) times  daily.    Historical Provider, MD  cloNIDine (CATAPRES) 0.3 MG tablet Take 1 tablet (0.3 mg total) by mouth 2 (two) times daily. 11/02/13 11/02/14  Fayrene Helper, MD  docusate sodium (COLACE) 100 MG capsule Take 100 mg by mouth daily as needed for mild constipation.    Historical Provider, MD  Garlic 1103 MG CAPS Take by mouth.      Historical Provider, MD  gemfibrozil (LOPID) 600 MG tablet Take 1 tablet (600 mg total) by mouth 2 (two) times daily before a meal. 11/02/13   Fayrene Helper, MD  metoprolol succinate (TOPROL-XL) 100 MG 24 hr tablet Take 1 tablet (100 mg  total) by mouth daily. Take with or immediately following a meal. 11/02/13 11/02/14  Fayrene Helper, MD  Multiple Vitamins-Minerals (MULTIVITAMIN WITH MINERALS) tablet Take 1 tablet by mouth daily.    Historical Provider, MD  Omega-3 Fatty Acids (FISH OIL) 1000 MG CAPS Take 2 capsules by mouth daily.      Historical Provider, MD  simvastatin (ZOCOR) 20 MG tablet Take 1 tablet (20 mg total) by mouth at bedtime. 11/02/13   Fayrene Helper, MD  triamterene-hydrochlorothiazide (MAXZIDE-25) 37.5-25 MG per tablet Take 1 tablet by mouth daily. 01/25/14 06/25/14  Fayrene Helper, MD   BP 194/75  Pulse 72  Temp(Src) 98.4 F (36.9 C) (Oral)  Resp 24  SpO2 98% Physical Exam 1300: Physical examination:  Nursing notes reviewed; Vital signs and O2 SAT reviewed;  Constitutional: Well developed, Well nourished, Well hydrated, In no acute distress; Head:  Normocephalic, atraumatic; Eyes: EOMI, PERRL, No scleral icterus; ENMT: Mouth and pharynx normal, Mucous membranes moist; Neck: Supple, Full range of motion, No lymphadenopathy; Cardiovascular: Regular rate and rhythm, No gallop; Respiratory: Breath sounds clear & equal bilaterally, No wheezes.  Speaking full sentences with ease, Normal respiratory effort/excursion; Chest: Nontender, Movement normal; Abdomen: Soft, Nontender, Nondistended, Normal bowel sounds; Genitourinary: No CVA tenderness; Extremities: Pulses normal, No tenderness, +3 pedal edema bilat feet to thighs with chronic stasis changes, no calf asymmetry.; Neuro: AA&Ox3, Major CN grossly intact.  Speech clear. No gross focal motor or sensory deficits in extremities.; Skin: Color normal, Warm, Dry.   ED Course  Procedures     EKG Interpretation None      MDM  MDM Reviewed: previous chart, nursing note and vitals Reviewed previous: labs Interpretation: labs   Results for orders placed in visit on 01/25/14  LIPID PANEL      Result Value Ref Range   Cholesterol 215 (*) 0 - 200 mg/dL    Triglycerides 87  <150 mg/dL   HDL 52  >39 mg/dL   Total CHOL/HDL Ratio 4.1     VLDL 17  0 - 40 mg/dL   LDL Cholesterol 146 (*) 0 - 99 mg/dL  COMPREHENSIVE METABOLIC PANEL      Result Value Ref Range   Sodium 142  135 - 145 mEq/L   Potassium 3.7  3.5 - 5.3 mEq/L   Chloride 109  96 - 112 mEq/L   CO2 25  19 - 32 mEq/L   Glucose, Bld 72  70 - 99 mg/dL   BUN 45 (*) 6 - 23 mg/dL   Creat 3.28 (*) 0.50 - 1.35 mg/dL   Total Bilirubin 0.2  0.2 - 1.2 mg/dL   Alkaline Phosphatase 125 (*) 39 - 117 U/L   AST 29  0 - 37 U/L   ALT 17  0 - 53 U/L   Total Protein 4.9 (*)  6.0 - 8.3 g/dL   Albumin 2.0 (*) 3.5 - 5.2 g/dL   Calcium 8.2 (*) 8.4 - 10.5 mg/dL  TSH      Result Value Ref Range   TSH 2.028  0.350 - 4.500 uIU/mL  CBC WITH DIFFERENTIAL      Result Value Ref Range   WBC 10.0  4.0 - 10.5 K/uL   RBC 4.43  4.22 - 5.81 MIL/uL   Hemoglobin 14.1  13.0 - 17.0 g/dL   HCT 40.4  39.0 - 52.0 %   MCV 91.2  78.0 - 100.0 fL   MCH 31.8  26.0 - 34.0 pg   MCHC 34.9  30.0 - 36.0 g/dL   RDW 13.2  11.5 - 15.5 %   Platelets 272  150 - 400 K/uL   Neutrophils Relative % 84 (*) 43 - 77 %   Neutro Abs 8.4 (*) 1.7 - 7.7 K/uL   Lymphocytes Relative 9 (*) 12 - 46 %   Lymphs Abs 0.9  0.7 - 4.0 K/uL   Monocytes Relative 7  3 - 12 %   Monocytes Absolute 0.7  0.1 - 1.0 K/uL   Eosinophils Relative 0  0 - 5 %   Eosinophils Absolute 0.0  0.0 - 0.7 K/uL   Basophils Relative 0  0 - 1 %   Basophils Absolute 0.0  0.0 - 0.1 K/uL   Smear Review Criteria for review not met    BASIC METABOLIC PANEL      Result Value Ref Range   Sodium 140  135 - 145 mEq/L   Potassium 3.3 (*) 3.5 - 5.3 mEq/L   Chloride 101  96 - 112 mEq/L   CO2 24  19 - 32 mEq/L   Glucose, Bld 77  70 - 99 mg/dL   BUN 44 (*) 6 - 23 mg/dL   Creat 3.62 (*) 0.50 - 1.35 mg/dL   Calcium 8.8  8.4 - 10.5 mg/dL  PTH, INTACT AND CALCIUM      Result Value Ref Range   PTH    14.0 - 72.0 pg/mL   Calcium    8.4 - 10.5 mg/dL    Results for DIAZ, CRAGO (MRN [id number]) as of 01/28/2014 13:27  Ref. Range 07/02/2013 08:54 10/12/2013 16:23 01/25/2014 10:09 01/28/2014 07:51  BUN Latest Range: 6-23 mg/dL 15 21 45 (H) 44 (H)  Creatinine Latest Range: 0.50-1.35 mg/dL 1.14 1.37 (H) 3.28 (H) 3.62 (H)     1325:   Pt and family informed re: dx testing results, including new BUN/Cr elevation concerning for acute renal failure, and that I recommend admission for further evaluation.  Pt refuses admission. Pt states he "just is going to go to [facility] and see my Renal doctor."  I encouraged pt to stay, continues to refuse.  Pt makes his own medical decisions.  Risks of AMA explained to pt and family, including, but not limited to:  Renal failure, uremia, sepsis, stroke, heart attack, cardiac arrythmia ("irregular heart rate/beat"), "passing out," temporary and/or permanent disability, death.  Pt and family verb understanding and continue to refuse admission, understanding the consequences of their decision.  I encouraged pt to follow up with his Renal doctor at [facility] today, and return to the ED immediately if he cannot be seen by his Renal doctor, he changes his mind about admission, or for any other concerns.  Pt and family verb understanding, agreeable.    Alfonzo Feller, DO 01/28/14 1640

## 2014-01-28 NOTE — Telephone Encounter (Signed)
noted 

## 2014-01-28 NOTE — Telephone Encounter (Signed)
Billie just called and stated that [facility] but he has called his daughter to come pick them up [facility]

## 2014-01-28 NOTE — ED Notes (Signed)
Pt and family [facility] to [facility] to see their kidney doctor there.

## 2014-01-28 NOTE — Telephone Encounter (Signed)
fyi

## 2014-01-28 NOTE — ED Notes (Signed)
Sent here from dr. Luther Parodysimpsons office for abnormal lab work.  Per pcp pt need ivf.

## 2014-01-30 DIAGNOSIS — E8809 Other disorders of plasma-protein metabolism, not elsewhere classified: Secondary | ICD-10-CM | POA: Insufficient documentation

## 2014-01-30 NOTE — Assessment & Plan Note (Addendum)
F/u colonoscopy past due , past h/o colon polyps, now with hypoalbuminema and decreased clcium, refer to [facility]I , PTH pending, pt aware of need for rept colonoscopy baed on recommendation of rept strudy in 5 years and he agrees to go

## 2014-01-30 NOTE — Assessment & Plan Note (Addendum)
Controlled,  May need to consider changing the amlodipine due to chronic leg edema, decreased sodium iuntake and leg elevation advised and encouraged

## 2014-01-30 NOTE — Assessment & Plan Note (Signed)
Recent ablation to right lower extremity with some success per pt , has upcoming procedure scheduled for right lower ext, c/o right upper extremity swelling 2 days after procedure on left lower ext, referred to vascular surgeon, for eval of new upper ext swelling, n palpable lymphadenopathy , no evidence of infection. No connection between recent procedure and new swelling  In my opinion, but evaluation of vascular system needed as soon as possible, appt made prior o topt leaving office . He is to seek immediate attention if symptoms worsen

## 2014-01-30 NOTE — Assessment & Plan Note (Signed)
appt this week as scheduled for f/u

## 2014-01-30 NOTE — Assessment & Plan Note (Signed)
Deteriorated. Hyperlipidemia:Low fat diet discussed and encouraged.  No med change at this time

## 2014-01-31 LAB — PTH, INTACT AND CALCIUM
CALCIUM: 8.4 mg/dL (ref 8.4–10.5)
PTH: 33.1 pg/mL (ref 14.0–72.0)

## 2014-02-07 ENCOUNTER — Telehealth: Payer: Self-pay | Admitting: Family Medicine

## 2014-02-10 NOTE — Telephone Encounter (Signed)
Referral submitted to [facility] on 5/5 per request from [facility]

## 2014-02-11 NOTE — Telephone Encounter (Signed)
Faxed over [employer] auth

## 2014-02-14 ENCOUNTER — Other Ambulatory Visit: Payer: Commercial Managed Care - HMO | Admitting: Vascular Surgery

## 2014-02-21 ENCOUNTER — Encounter (HOSPITAL_COMMUNITY): Payer: Commercial Managed Care - HMO

## 2014-02-21 ENCOUNTER — Ambulatory Visit: Payer: Commercial Managed Care - HMO | Admitting: Vascular Surgery

## 2014-03-01 ENCOUNTER — Ambulatory Visit (INDEPENDENT_AMBULATORY_CARE_PROVIDER_SITE_OTHER): Payer: Commercial Managed Care - HMO | Admitting: *Deleted

## 2014-03-01 DIAGNOSIS — I82629 Acute embolism and thrombosis of deep veins of unspecified upper extremity: Secondary | ICD-10-CM | POA: Insufficient documentation

## 2014-03-01 DIAGNOSIS — Z5181 Encounter for therapeutic drug level monitoring: Secondary | ICD-10-CM | POA: Insufficient documentation

## 2014-03-01 DIAGNOSIS — Z8673 Personal history of transient ischemic attack (TIA), and cerebral infarction without residual deficits: Secondary | ICD-10-CM

## 2014-03-01 LAB — POCT INR: INR: 1.9

## 2014-03-04 ENCOUNTER — Ambulatory Visit (INDEPENDENT_AMBULATORY_CARE_PROVIDER_SITE_OTHER): Payer: Commercial Managed Care - HMO | Admitting: *Deleted

## 2014-03-04 DIAGNOSIS — Z5181 Encounter for therapeutic drug level monitoring: Secondary | ICD-10-CM

## 2014-03-04 DIAGNOSIS — Z8673 Personal history of transient ischemic attack (TIA), and cerebral infarction without residual deficits: Secondary | ICD-10-CM

## 2014-03-04 DIAGNOSIS — I82629 Acute embolism and thrombosis of deep veins of unspecified upper extremity: Secondary | ICD-10-CM

## 2014-03-04 LAB — POCT INR: INR: 2.6

## 2014-03-07 ENCOUNTER — Telehealth: Payer: Self-pay

## 2014-03-07 NOTE — Telephone Encounter (Signed)
Spoke to significant other (Billie) and advised that patient go to the ER based on his labs- renal failure, dehydration, hyperkalemia. She states she just spoke with the kidney specialist, Dr Roxan Hockey and he advised to stop potassium and Aldactone and is going to repeat labs in 2 days and decide at that time if patient needs to start dialysis. Willaim Sheng states neither of them drive so going to the ED isn't an option. She states she will follow up closely with Dr Roxan Hockey and if patient needs to go to the ED that they will find a way to get him there. Wanted me to let you know what the plan was.

## 2014-03-07 NOTE — Telephone Encounter (Signed)
Pt is going to ED for  Re eval, [facility]

## 2014-03-07 NOTE — Telephone Encounter (Signed)
I spoke directly with the pt and he now agrees that he will return to the hospital for evaluation an management, I let him know that this is a good idea

## 2014-03-08 ENCOUNTER — Telehealth: Payer: Self-pay | Admitting: Family Medicine

## 2014-03-08 ENCOUNTER — Telehealth: Payer: Self-pay | Admitting: Gastroenterology

## 2014-03-08 ENCOUNTER — Ambulatory Visit: Payer: Commercial Managed Care - HMO | Admitting: Gastroenterology

## 2014-03-08 ENCOUNTER — Encounter: Payer: Self-pay | Admitting: Gastroenterology

## 2014-03-08 NOTE — Telephone Encounter (Signed)
Mailed letter °

## 2014-03-08 NOTE — Telephone Encounter (Signed)
Pt was a no show

## 2014-03-08 NOTE — Telephone Encounter (Signed)
Noted, thanks!

## 2014-03-10 ENCOUNTER — Ambulatory Visit: Payer: Commercial Managed Care - HMO | Admitting: Family Medicine

## 2014-04-06 DEATH — deceased

## 2014-04-27 ENCOUNTER — Encounter: Payer: Medicare HMO | Admitting: Family Medicine

## 2014-05-24 ENCOUNTER — Ambulatory Visit: Payer: Commercial Managed Care - HMO | Admitting: Family Medicine

## 2014-05-25 ENCOUNTER — Ambulatory Visit: Payer: Commercial Managed Care - HMO | Admitting: Family Medicine
